# Patient Record
Sex: Female | Born: 1960 | Race: White | Hispanic: No | Smoking: Current every day smoker
Health system: Southern US, Community
[De-identification: ages and names within clinical notes are randomized; demographics above are authoritative.]

## PROBLEM LIST (undated history)

## (undated) DIAGNOSIS — M549 Dorsalgia, unspecified: Secondary | ICD-10-CM

## (undated) DIAGNOSIS — M797 Fibromyalgia: Secondary | ICD-10-CM

## (undated) DIAGNOSIS — E079 Disorder of thyroid, unspecified: Secondary | ICD-10-CM

---

## 2005-12-31 ENCOUNTER — Ambulatory Visit: Payer: Self-pay | Admitting: Family Medicine

## 2006-02-19 DIAGNOSIS — F17200 Nicotine dependence, unspecified, uncomplicated: Secondary | ICD-10-CM | POA: Insufficient documentation

## 2006-02-19 DIAGNOSIS — N951 Menopausal and female climacteric states: Secondary | ICD-10-CM | POA: Insufficient documentation

## 2006-02-19 DIAGNOSIS — E049 Nontoxic goiter, unspecified: Secondary | ICD-10-CM | POA: Insufficient documentation

## 2006-02-19 DIAGNOSIS — IMO0001 Reserved for inherently not codable concepts without codable children: Secondary | ICD-10-CM | POA: Insufficient documentation

## 2006-12-23 ENCOUNTER — Ambulatory Visit: Payer: Self-pay | Admitting: Family Medicine

## 2006-12-23 DIAGNOSIS — M25559 Pain in unspecified hip: Secondary | ICD-10-CM | POA: Insufficient documentation

## 2006-12-23 DIAGNOSIS — N926 Irregular menstruation, unspecified: Secondary | ICD-10-CM | POA: Insufficient documentation

## 2012-09-15 ENCOUNTER — Emergency Department (HOSPITAL_BASED_OUTPATIENT_CLINIC_OR_DEPARTMENT_OTHER)
Admission: EM | Admit: 2012-09-15 | Discharge: 2012-09-15 | Disposition: A | Discharge status: Home / Self Care | Payer: Medicare Other | Attending: Emergency Medicine | Admitting: Emergency Medicine

## 2012-09-15 ENCOUNTER — Ambulatory Visit: Payer: Self-pay | Admitting: Physician Assistant

## 2012-09-15 ENCOUNTER — Encounter (HOSPITAL_BASED_OUTPATIENT_CLINIC_OR_DEPARTMENT_OTHER): Payer: Self-pay | Admitting: *Deleted

## 2012-09-15 DIAGNOSIS — F17200 Nicotine dependence, unspecified, uncomplicated: Secondary | ICD-10-CM | POA: Insufficient documentation

## 2012-09-15 DIAGNOSIS — Z0289 Encounter for other administrative examinations: Secondary | ICD-10-CM

## 2012-09-15 DIAGNOSIS — M545 Low back pain, unspecified: Secondary | ICD-10-CM

## 2012-09-15 DIAGNOSIS — Z8639 Personal history of other endocrine, nutritional and metabolic disease: Secondary | ICD-10-CM | POA: Insufficient documentation

## 2012-09-15 DIAGNOSIS — Z862 Personal history of diseases of the blood and blood-forming organs and certain disorders involving the immune mechanism: Secondary | ICD-10-CM | POA: Insufficient documentation

## 2012-09-15 DIAGNOSIS — Z79899 Other long term (current) drug therapy: Secondary | ICD-10-CM | POA: Insufficient documentation

## 2012-09-15 DIAGNOSIS — IMO0001 Reserved for inherently not codable concepts without codable children: Secondary | ICD-10-CM | POA: Insufficient documentation

## 2012-09-15 HISTORY — DX: Fibromyalgia: M79.7

## 2012-09-15 HISTORY — DX: Disorder of thyroid, unspecified: E07.9

## 2012-09-15 LAB — URINALYSIS, ROUTINE W REFLEX MICROSCOPIC
Bilirubin Urine: NEGATIVE
Ketones, ur: NEGATIVE mg/dL
Nitrite: NEGATIVE
Protein, ur: NEGATIVE mg/dL

## 2012-09-15 MED ORDER — METHOCARBAMOL 500 MG PO TABS: 500.0000 mg | ORAL_TABLET | Freq: Two times a day (BID) | ORAL | Status: DC

## 2012-09-15 MED ORDER — PREDNISONE 20 MG PO TABS: 40.0000 mg | ORAL_TABLET | Freq: Every day | ORAL | Status: DC

## 2012-09-15 NOTE — ED Notes (Signed)
Pt declined wheelchair to transport to room.  Pt requested to walk

## 2012-09-15 NOTE — ED Notes (Signed)
Pt alert and oriented.  Appears in no distress but patient not happy about not having xray.  Pt states "I need an MRI and someone is going to have to do it"  Informed can get rx here at pharmacy but patient refused to get them filled here at [facility].

## 2012-09-15 NOTE — ED Notes (Signed)
PA at bedside.

## 2012-09-15 NOTE — ED Provider Notes (Signed)
History     CSN: [id number]  Arrival date & time 09/15/12  1455   First MD Initiated Contact with Patient 09/15/12 1504      Chief Complaint  Patient presents with  . Back Pain    (Consider location/radiation/quality/duration/timing/severity/associated sxs/prior treatment) HPI Comments: Patient presents with lower back pain.  Pain located across her lower back.  Pain does not radiate.  No acute injury or trauma.  She was seen in the ED at [facility] 1.5 weeks ago for the same pain.  She reports that they gave her a prescription for Prednisone, which she has forgotten to take.  She reports that she has also taken Flexeril, but felt that the Flexeril made her tired.  She is currently on Methadone tid and Norco for pain associated with Fibromyalgia.  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  Patient is a 52 y.o. female presenting with back pain. The history is provided by the patient.  Back Pain Location:  Lumbar spine (across lower back) Quality:  Stabbing Radiates to:  Does not radiate Pain is:  Same all the time Onset quality:  Gradual Duration:  2 weeks Timing:  Constant Progression:  Unchanged Context: not lifting heavy objects   Relieved by:  Nothing Worsened by:  Bending and sitting Associated symptoms: no bladder incontinence, no bowel incontinence, no dysuria, no fever, no leg pain, no numbness, no paresthesias, no tingling and no weakness   Risk factors: no hx of cancer   Risk factors comment:  No history of IVDU.   Not immunocompromised.   Past Medical History  Diagnosis Date  . Fibromyalgia   . Thyroid disease     History reviewed. No pertinent past surgical history.  No family history on file.  History  Substance Use Topics  . Smoking status: Current Every Day Smoker -- 1.00 packs/day    Types: Cigarettes  . Smokeless tobacco: Not on file  . Alcohol Use: No    OB History   Grav Para Term Preterm Abortions TAB SAB  Ect Mult Living                  Review of Systems  Constitutional: Negative for fever and chills.  Gastrointestinal: Negative for bowel incontinence.  Genitourinary: Negative for bladder incontinence and dysuria.  Musculoskeletal: Positive for back pain.  Neurological: Negative for tingling, weakness, numbness and paresthesias.    Allergies  Acetaminophen-codeine  Home Medications   Current Outpatient Rx  Name  Route  Sig  Dispense  Refill  . Hydrocodone-Acetaminophen (NORCO PO)   Oral   Take by mouth.         . methadone (DOLOPHINE) 10 MG tablet   Oral   Take 10 mg by mouth every 8 (eight) hours.           BP 130/96  Pulse 58  Temp(Src) 98.8 F (37.1 C) (Oral)  Resp 18  Wt 151 lb (68.493 kg)  SpO2 97%  Physical Exam  Nursing note and vitals reviewed. Constitutional: She appears well-developed and well-nourished.  HENT:  Head: Normocephalic and atraumatic.  Neck: Normal range of motion. Neck supple.  Cardiovascular: Normal rate, regular rhythm and normal heart sounds.   Pulmonary/Chest: Effort normal and breath sounds normal.  Musculoskeletal: Normal range of motion.       Lumbar back: She exhibits tenderness and bony tenderness. She exhibits normal range of motion, no swelling, no edema and no deformity.  Pain with extension  of the lower back and lateral rotation.  Neurological: She is alert. She has normal strength. No sensory deficit. Gait normal.  Reflex Scores:      Patellar reflexes are 2+ on the right side and 2+ on the left side.      Achilles reflexes are 2+ on the right side and 2+ on the left side. Skin: Skin is warm and dry. No rash noted. No erythema.  Psychiatric: She has a normal mood and affect.    ED Course  Procedures (including critical care time)  Labs Reviewed  URINALYSIS, ROUTINE W REFLEX MICROSCOPIC   No results found.   No diagnosis found.    MDM  Patient with back pain.  No neurological deficits and normal neuro  exam.  Patient can walk but states is painful.  No loss of bowel or bladder control.  No concern for cauda equina.  No fever, night sweats, weight loss, h/o cancer, IVDU.  RICE protocol  discussed with patient.  Patient given prescription for Prednisone and Robaxin.  She is currently on Methadone and Norco for pain.  Return precautions given.        Pascal Lux [place], PA-C 09/15/12 [phone]

## 2012-09-15 NOTE — ED Notes (Signed)
Lower back pain x 2 weeks

## 2012-09-17 NOTE — ED Provider Notes (Signed)
Medical screening examination/treatment/procedure(s) were performed by non-physician practitioner and as supervising physician I was immediately available for consultation/collaboration.   Shelda Jakes, MD 09/17/12 [phone]

## 2015-01-13 ENCOUNTER — Ambulatory Visit: Payer: Medicare Other | Admitting: Rehabilitative and Restorative Service Providers"

## 2016-07-24 DIAGNOSIS — G894 Chronic pain syndrome: Secondary | ICD-10-CM | POA: Diagnosis not present

## 2016-07-24 DIAGNOSIS — G4700 Insomnia, unspecified: Secondary | ICD-10-CM | POA: Diagnosis not present

## 2016-07-24 DIAGNOSIS — M170 Bilateral primary osteoarthritis of knee: Secondary | ICD-10-CM | POA: Diagnosis not present

## 2016-07-24 DIAGNOSIS — M47816 Spondylosis without myelopathy or radiculopathy, lumbar region: Secondary | ICD-10-CM | POA: Diagnosis not present

## 2016-08-21 DIAGNOSIS — M47816 Spondylosis without myelopathy or radiculopathy, lumbar region: Secondary | ICD-10-CM | POA: Diagnosis not present

## 2016-08-21 DIAGNOSIS — M170 Bilateral primary osteoarthritis of knee: Secondary | ICD-10-CM | POA: Diagnosis not present

## 2016-08-21 DIAGNOSIS — G4700 Insomnia, unspecified: Secondary | ICD-10-CM | POA: Diagnosis not present

## 2016-08-21 DIAGNOSIS — G894 Chronic pain syndrome: Secondary | ICD-10-CM | POA: Diagnosis not present

## 2016-09-19 DIAGNOSIS — M47816 Spondylosis without myelopathy or radiculopathy, lumbar region: Secondary | ICD-10-CM | POA: Diagnosis not present

## 2016-09-19 DIAGNOSIS — G894 Chronic pain syndrome: Secondary | ICD-10-CM | POA: Diagnosis not present

## 2016-09-19 DIAGNOSIS — M170 Bilateral primary osteoarthritis of knee: Secondary | ICD-10-CM | POA: Diagnosis not present

## 2016-09-19 DIAGNOSIS — G4700 Insomnia, unspecified: Secondary | ICD-10-CM | POA: Diagnosis not present

## 2016-10-07 DIAGNOSIS — Z8709 Personal history of other diseases of the respiratory system: Secondary | ICD-10-CM | POA: Diagnosis not present

## 2016-10-07 DIAGNOSIS — M797 Fibromyalgia: Secondary | ICD-10-CM | POA: Diagnosis not present

## 2016-10-07 DIAGNOSIS — M545 Low back pain: Secondary | ICD-10-CM | POA: Diagnosis not present

## 2016-10-07 DIAGNOSIS — G8929 Other chronic pain: Secondary | ICD-10-CM | POA: Diagnosis not present

## 2016-10-07 DIAGNOSIS — M5136 Other intervertebral disc degeneration, lumbar region: Secondary | ICD-10-CM | POA: Diagnosis not present

## 2016-10-07 DIAGNOSIS — Z886 Allergy status to analgesic agent status: Secondary | ICD-10-CM | POA: Diagnosis not present

## 2016-10-07 DIAGNOSIS — Z720 Tobacco use: Secondary | ICD-10-CM | POA: Diagnosis not present

## 2016-10-17 DIAGNOSIS — G4700 Insomnia, unspecified: Secondary | ICD-10-CM | POA: Diagnosis not present

## 2016-10-17 DIAGNOSIS — M170 Bilateral primary osteoarthritis of knee: Secondary | ICD-10-CM | POA: Diagnosis not present

## 2016-10-17 DIAGNOSIS — G894 Chronic pain syndrome: Secondary | ICD-10-CM | POA: Diagnosis not present

## 2016-10-17 DIAGNOSIS — M47816 Spondylosis without myelopathy or radiculopathy, lumbar region: Secondary | ICD-10-CM | POA: Diagnosis not present

## 2016-12-17 DIAGNOSIS — G894 Chronic pain syndrome: Secondary | ICD-10-CM | POA: Diagnosis not present

## 2016-12-17 DIAGNOSIS — M5417 Radiculopathy, lumbosacral region: Secondary | ICD-10-CM | POA: Diagnosis not present

## 2016-12-17 DIAGNOSIS — M47816 Spondylosis without myelopathy or radiculopathy, lumbar region: Secondary | ICD-10-CM | POA: Diagnosis not present

## 2016-12-17 DIAGNOSIS — M545 Low back pain: Secondary | ICD-10-CM | POA: Diagnosis not present

## 2016-12-17 DIAGNOSIS — M1990 Unspecified osteoarthritis, unspecified site: Secondary | ICD-10-CM | POA: Diagnosis not present

## 2016-12-17 DIAGNOSIS — G4700 Insomnia, unspecified: Secondary | ICD-10-CM | POA: Diagnosis not present

## 2016-12-17 DIAGNOSIS — M170 Bilateral primary osteoarthritis of knee: Secondary | ICD-10-CM | POA: Diagnosis not present

## 2016-12-17 DIAGNOSIS — Z79891 Long term (current) use of opiate analgesic: Secondary | ICD-10-CM | POA: Diagnosis not present

## 2016-12-19 ENCOUNTER — Other Ambulatory Visit: Payer: Self-pay | Admitting: Physical Medicine and Rehabilitation

## 2016-12-19 DIAGNOSIS — M5416 Radiculopathy, lumbar region: Secondary | ICD-10-CM

## 2016-12-24 ENCOUNTER — Ambulatory Visit (INDEPENDENT_AMBULATORY_CARE_PROVIDER_SITE_OTHER): Payer: Medicare Other

## 2016-12-24 DIAGNOSIS — M48061 Spinal stenosis, lumbar region without neurogenic claudication: Secondary | ICD-10-CM

## 2016-12-24 DIAGNOSIS — M5126 Other intervertebral disc displacement, lumbar region: Secondary | ICD-10-CM

## 2016-12-24 DIAGNOSIS — M545 Low back pain: Secondary | ICD-10-CM | POA: Diagnosis not present

## 2016-12-24 DIAGNOSIS — M5416 Radiculopathy, lumbar region: Secondary | ICD-10-CM

## 2016-12-31 ENCOUNTER — Other Ambulatory Visit: Payer: Medicare Other

## 2017-01-16 DIAGNOSIS — G4700 Insomnia, unspecified: Secondary | ICD-10-CM | POA: Diagnosis not present

## 2017-01-16 DIAGNOSIS — G894 Chronic pain syndrome: Secondary | ICD-10-CM | POA: Diagnosis not present

## 2017-01-16 DIAGNOSIS — M47816 Spondylosis without myelopathy or radiculopathy, lumbar region: Secondary | ICD-10-CM | POA: Diagnosis not present

## 2017-01-16 DIAGNOSIS — M170 Bilateral primary osteoarthritis of knee: Secondary | ICD-10-CM | POA: Diagnosis not present

## 2017-02-11 DIAGNOSIS — G4700 Insomnia, unspecified: Secondary | ICD-10-CM | POA: Diagnosis not present

## 2017-02-11 DIAGNOSIS — G894 Chronic pain syndrome: Secondary | ICD-10-CM | POA: Diagnosis not present

## 2017-02-11 DIAGNOSIS — M47816 Spondylosis without myelopathy or radiculopathy, lumbar region: Secondary | ICD-10-CM | POA: Diagnosis not present

## 2017-02-11 DIAGNOSIS — M170 Bilateral primary osteoarthritis of knee: Secondary | ICD-10-CM | POA: Diagnosis not present

## 2017-03-11 DIAGNOSIS — M47816 Spondylosis without myelopathy or radiculopathy, lumbar region: Secondary | ICD-10-CM | POA: Diagnosis not present

## 2017-03-11 DIAGNOSIS — M170 Bilateral primary osteoarthritis of knee: Secondary | ICD-10-CM | POA: Diagnosis not present

## 2017-03-11 DIAGNOSIS — G4700 Insomnia, unspecified: Secondary | ICD-10-CM | POA: Diagnosis not present

## 2017-03-11 DIAGNOSIS — G894 Chronic pain syndrome: Secondary | ICD-10-CM | POA: Diagnosis not present

## 2017-04-08 DIAGNOSIS — G4700 Insomnia, unspecified: Secondary | ICD-10-CM | POA: Diagnosis not present

## 2017-04-08 DIAGNOSIS — M47816 Spondylosis without myelopathy or radiculopathy, lumbar region: Secondary | ICD-10-CM | POA: Diagnosis not present

## 2017-04-08 DIAGNOSIS — M170 Bilateral primary osteoarthritis of knee: Secondary | ICD-10-CM | POA: Diagnosis not present

## 2017-04-08 DIAGNOSIS — G894 Chronic pain syndrome: Secondary | ICD-10-CM | POA: Diagnosis not present

## 2017-05-10 DIAGNOSIS — G4700 Insomnia, unspecified: Secondary | ICD-10-CM | POA: Diagnosis not present

## 2017-05-10 DIAGNOSIS — G894 Chronic pain syndrome: Secondary | ICD-10-CM | POA: Diagnosis not present

## 2017-05-10 DIAGNOSIS — M47816 Spondylosis without myelopathy or radiculopathy, lumbar region: Secondary | ICD-10-CM | POA: Diagnosis not present

## 2017-05-10 DIAGNOSIS — M170 Bilateral primary osteoarthritis of knee: Secondary | ICD-10-CM | POA: Diagnosis not present

## 2017-06-04 DIAGNOSIS — G894 Chronic pain syndrome: Secondary | ICD-10-CM | POA: Diagnosis not present

## 2017-06-04 DIAGNOSIS — M47816 Spondylosis without myelopathy or radiculopathy, lumbar region: Secondary | ICD-10-CM | POA: Diagnosis not present

## 2017-06-04 DIAGNOSIS — M170 Bilateral primary osteoarthritis of knee: Secondary | ICD-10-CM | POA: Diagnosis not present

## 2017-06-04 DIAGNOSIS — G4700 Insomnia, unspecified: Secondary | ICD-10-CM | POA: Diagnosis not present

## 2017-07-05 DIAGNOSIS — M47816 Spondylosis without myelopathy or radiculopathy, lumbar region: Secondary | ICD-10-CM | POA: Diagnosis not present

## 2017-07-05 DIAGNOSIS — G4700 Insomnia, unspecified: Secondary | ICD-10-CM | POA: Diagnosis not present

## 2017-07-05 DIAGNOSIS — G894 Chronic pain syndrome: Secondary | ICD-10-CM | POA: Diagnosis not present

## 2017-07-05 DIAGNOSIS — M170 Bilateral primary osteoarthritis of knee: Secondary | ICD-10-CM | POA: Diagnosis not present

## 2017-08-05 DIAGNOSIS — Z79891 Long term (current) use of opiate analgesic: Secondary | ICD-10-CM | POA: Diagnosis not present

## 2017-08-05 DIAGNOSIS — M47816 Spondylosis without myelopathy or radiculopathy, lumbar region: Secondary | ICD-10-CM | POA: Diagnosis not present

## 2017-08-05 DIAGNOSIS — G4700 Insomnia, unspecified: Secondary | ICD-10-CM | POA: Diagnosis not present

## 2017-08-05 DIAGNOSIS — G894 Chronic pain syndrome: Secondary | ICD-10-CM | POA: Diagnosis not present

## 2017-08-05 DIAGNOSIS — M170 Bilateral primary osteoarthritis of knee: Secondary | ICD-10-CM | POA: Diagnosis not present

## 2017-09-02 DIAGNOSIS — M170 Bilateral primary osteoarthritis of knee: Secondary | ICD-10-CM | POA: Diagnosis not present

## 2017-09-02 DIAGNOSIS — M47816 Spondylosis without myelopathy or radiculopathy, lumbar region: Secondary | ICD-10-CM | POA: Diagnosis not present

## 2017-09-02 DIAGNOSIS — G4700 Insomnia, unspecified: Secondary | ICD-10-CM | POA: Diagnosis not present

## 2017-09-02 DIAGNOSIS — G894 Chronic pain syndrome: Secondary | ICD-10-CM | POA: Diagnosis not present

## 2017-10-01 DIAGNOSIS — G894 Chronic pain syndrome: Secondary | ICD-10-CM | POA: Diagnosis not present

## 2017-10-01 DIAGNOSIS — G4700 Insomnia, unspecified: Secondary | ICD-10-CM | POA: Diagnosis not present

## 2017-10-01 DIAGNOSIS — M47816 Spondylosis without myelopathy or radiculopathy, lumbar region: Secondary | ICD-10-CM | POA: Diagnosis not present

## 2017-10-01 DIAGNOSIS — M170 Bilateral primary osteoarthritis of knee: Secondary | ICD-10-CM | POA: Diagnosis not present

## 2017-11-28 DIAGNOSIS — G4700 Insomnia, unspecified: Secondary | ICD-10-CM | POA: Diagnosis not present

## 2017-11-28 DIAGNOSIS — M47816 Spondylosis without myelopathy or radiculopathy, lumbar region: Secondary | ICD-10-CM | POA: Diagnosis not present

## 2017-11-28 DIAGNOSIS — M170 Bilateral primary osteoarthritis of knee: Secondary | ICD-10-CM | POA: Diagnosis not present

## 2017-11-28 DIAGNOSIS — G894 Chronic pain syndrome: Secondary | ICD-10-CM | POA: Diagnosis not present

## 2017-12-26 DIAGNOSIS — G894 Chronic pain syndrome: Secondary | ICD-10-CM | POA: Diagnosis not present

## 2017-12-26 DIAGNOSIS — M47816 Spondylosis without myelopathy or radiculopathy, lumbar region: Secondary | ICD-10-CM | POA: Diagnosis not present

## 2017-12-26 DIAGNOSIS — G4700 Insomnia, unspecified: Secondary | ICD-10-CM | POA: Diagnosis not present

## 2017-12-26 DIAGNOSIS — M170 Bilateral primary osteoarthritis of knee: Secondary | ICD-10-CM | POA: Diagnosis not present

## 2017-12-27 DIAGNOSIS — M5416 Radiculopathy, lumbar region: Secondary | ICD-10-CM | POA: Diagnosis not present

## 2018-01-02 DIAGNOSIS — S8002XA Contusion of left knee, initial encounter: Secondary | ICD-10-CM | POA: Diagnosis not present

## 2018-01-02 DIAGNOSIS — Z885 Allergy status to narcotic agent status: Secondary | ICD-10-CM | POA: Diagnosis not present

## 2018-01-02 DIAGNOSIS — S39012A Strain of muscle, fascia and tendon of lower back, initial encounter: Secondary | ICD-10-CM | POA: Diagnosis not present

## 2018-01-02 DIAGNOSIS — S161XXA Strain of muscle, fascia and tendon at neck level, initial encounter: Secondary | ICD-10-CM | POA: Diagnosis not present

## 2018-01-02 DIAGNOSIS — R52 Pain, unspecified: Secondary | ICD-10-CM | POA: Diagnosis not present

## 2018-01-07 DIAGNOSIS — M170 Bilateral primary osteoarthritis of knee: Secondary | ICD-10-CM | POA: Diagnosis not present

## 2018-01-07 DIAGNOSIS — G4700 Insomnia, unspecified: Secondary | ICD-10-CM | POA: Diagnosis not present

## 2018-01-07 DIAGNOSIS — G894 Chronic pain syndrome: Secondary | ICD-10-CM | POA: Diagnosis not present

## 2018-01-07 DIAGNOSIS — M47816 Spondylosis without myelopathy or radiculopathy, lumbar region: Secondary | ICD-10-CM | POA: Diagnosis not present

## 2018-01-09 ENCOUNTER — Emergency Department (INDEPENDENT_AMBULATORY_CARE_PROVIDER_SITE_OTHER)
Admission: EM | Admit: 2018-01-09 | Discharge: 2018-01-09 | Disposition: A | Discharge status: Home / Self Care | Payer: Self-pay | Source: Home / Self Care

## 2018-01-09 ENCOUNTER — Other Ambulatory Visit: Payer: Self-pay

## 2018-01-09 ENCOUNTER — Encounter: Payer: Self-pay | Admitting: Emergency Medicine

## 2018-01-09 DIAGNOSIS — S39012A Strain of muscle, fascia and tendon of lower back, initial encounter: Secondary | ICD-10-CM

## 2018-01-09 DIAGNOSIS — E049 Nontoxic goiter, unspecified: Secondary | ICD-10-CM

## 2018-01-09 DIAGNOSIS — M545 Low back pain, unspecified: Secondary | ICD-10-CM

## 2018-01-09 DIAGNOSIS — S29012A Strain of muscle and tendon of back wall of thorax, initial encounter: Secondary | ICD-10-CM

## 2018-01-09 DIAGNOSIS — S161XXA Strain of muscle, fascia and tendon at neck level, initial encounter: Secondary | ICD-10-CM

## 2018-01-09 DIAGNOSIS — G8929 Other chronic pain: Secondary | ICD-10-CM

## 2018-01-09 MED ORDER — PREDNISONE 20 MG PO TABS: 40.0000 mg | ORAL_TABLET | Freq: Every day | ORAL | 0 refills | Status: DC

## 2018-01-09 NOTE — Discharge Instructions (Addendum)
Try hot showers twice daily and gentle range of motion movements to keep the muscles from tightening.
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
## 2018-02-04 ENCOUNTER — Emergency Department (INDEPENDENT_AMBULATORY_CARE_PROVIDER_SITE_OTHER)
Admission: EM | Admit: 2018-02-04 | Discharge: 2018-02-04 | Disposition: A | Discharge status: Home / Self Care | Payer: Self-pay | Source: Home / Self Care | Attending: Family Medicine | Admitting: Family Medicine

## 2018-02-04 ENCOUNTER — Encounter: Payer: Self-pay | Admitting: Emergency Medicine

## 2018-02-04 ENCOUNTER — Emergency Department (INDEPENDENT_AMBULATORY_CARE_PROVIDER_SITE_OTHER): Payer: Medicare Other

## 2018-02-04 DIAGNOSIS — M546 Pain in thoracic spine: Secondary | ICD-10-CM | POA: Diagnosis not present

## 2018-02-04 DIAGNOSIS — M4804 Spinal stenosis, thoracic region: Secondary | ICD-10-CM

## 2018-02-04 DIAGNOSIS — M7918 Myalgia, other site: Secondary | ICD-10-CM

## 2018-02-04 DIAGNOSIS — G8929 Other chronic pain: Secondary | ICD-10-CM

## 2018-02-04 MED ORDER — PREDNISONE 10 MG PO TABS: ORAL_TABLET | ORAL | 0 refills | Status: DC

## 2018-02-04 NOTE — Discharge Instructions (Addendum)
Apply heating pad to painful areas 2 or 3 times daily.  Begin range of motion and stretching exercises as tolerated.
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
## 2018-02-10 ENCOUNTER — Encounter: Payer: Medicare Other | Admitting: Sports Medicine

## 2018-02-14 ENCOUNTER — Ambulatory Visit (INDEPENDENT_AMBULATORY_CARE_PROVIDER_SITE_OTHER): Payer: Self-pay | Admitting: Sports Medicine

## 2018-02-14 ENCOUNTER — Encounter: Payer: Self-pay | Admitting: Sports Medicine

## 2018-02-14 DIAGNOSIS — M47812 Spondylosis without myelopathy or radiculopathy, cervical region: Secondary | ICD-10-CM | POA: Insufficient documentation

## 2018-02-14 MED ORDER — MELOXICAM 15 MG PO TABS: ORAL_TABLET | ORAL | 3 refills | Status: DC
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
## 2018-02-24 DIAGNOSIS — G894 Chronic pain syndrome: Secondary | ICD-10-CM | POA: Diagnosis not present

## 2018-02-24 DIAGNOSIS — M170 Bilateral primary osteoarthritis of knee: Secondary | ICD-10-CM | POA: Diagnosis not present

## 2018-02-24 DIAGNOSIS — G4700 Insomnia, unspecified: Secondary | ICD-10-CM | POA: Diagnosis not present

## 2018-02-24 DIAGNOSIS — M47816 Spondylosis without myelopathy or radiculopathy, lumbar region: Secondary | ICD-10-CM | POA: Diagnosis not present

## 2018-03-06 ENCOUNTER — Telehealth: Payer: Self-pay | Admitting: Family Medicine

## 2018-03-06 NOTE — Telephone Encounter (Signed)
Look in the referral notes, they have tried multiple times to call her to reschedule with the [place] facility.  At this point she can just call [facility] [place] [facility] to schedule her appointment.

## 2018-03-14 ENCOUNTER — Ambulatory Visit: Payer: Medicare Other | Admitting: Sports Medicine

## 2018-03-17 ENCOUNTER — Encounter: Payer: Self-pay | Admitting: Sports Medicine

## 2018-03-17 ENCOUNTER — Ambulatory Visit (INDEPENDENT_AMBULATORY_CARE_PROVIDER_SITE_OTHER): Payer: Self-pay | Admitting: Sports Medicine

## 2018-03-17 ENCOUNTER — Ambulatory Visit (INDEPENDENT_AMBULATORY_CARE_PROVIDER_SITE_OTHER): Payer: Self-pay

## 2018-03-17 DIAGNOSIS — M47812 Spondylosis without myelopathy or radiculopathy, cervical region: Secondary | ICD-10-CM

## 2018-03-17 DIAGNOSIS — M50223 Other cervical disc displacement at C6-C7 level: Secondary | ICD-10-CM | POA: Diagnosis not present

## 2018-03-17 DIAGNOSIS — M50222 Other cervical disc displacement at C5-C6 level: Secondary | ICD-10-CM

## 2018-03-17 NOTE — Assessment & Plan Note (Signed)
Multilevel cervical DDD and facet arthritis. Several courses of prednisone have not improved symptoms. She has not done physical therapy and not had a cervical spine MRI. I am placing another referral to a different physical therapist, and I would like to see if we can get her MRI done today. She declines neuropathic agents such as gabapentin and Cymbalta. 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

## 2018-03-17 NOTE — Progress Notes (Signed)
Subjective:    CC: Follow-up  HPI: Neck pain: With multilevel cervical spondylosis, she has not done any physical therapy and has not had her MRI.  She also has lumbar DDD, her pain doctor has been doing epidurals, I have advised her to keep her care with her lumbar spine with the current treating provider.  I reviewed the past medical history, family history, social history, surgical history, and allergies today and no changes were needed.  Please see the problem list section below in epic for further details.  Past Medical History: Past Medical History:  Diagnosis Date  . Fibromyalgia   . Thyroid disease    Past Surgical History: No past surgical history on file. Social History: Social History   Socioeconomic History  . Marital status: Divorced    Spouse name: Not on file  . Number of children: Not on file  . Years of education: Not on file  . Highest education level: Not on file  Occupational History  . Not on file  Social Needs  . Financial resource strain: Not on file  . Food insecurity:    Worry: Not on file    Inability: Not on file  . Transportation needs:    Medical: Not on file    Non-medical: Not on file  Tobacco Use  . Smoking status: Current Every Day Smoker    Packs/day: 1.00    Types: Cigarettes  . Smokeless tobacco: Never Used  Substance and Sexual Activity  . Alcohol use: No  . Drug use: Not on file  . Sexual activity: Not on file  Lifestyle  . Physical activity:    Days per week: Not on file    Minutes per session: Not on file  . Stress: Not on file  Relationships  . Social connections:    Talks on phone: Not on file    Gets together: Not on file    Attends religious service: Not on file    Active member of club or organization: Not on file    Attends meetings of clubs or organizations: Not on file    Relationship status: Not on file  Other Topics Concern  . Not on file  Social History Narrative  . Not on file   Family History: No  family history on file. Allergies: Allergies  Allergen Reactions  . Acetaminophen-Codeine     REACTION: Rash and itching   Medications: See med rec.  Review of Systems: No fevers, chills, night sweats, weight loss, chest pain, or shortness of breath.   Objective:    General: Well Developed, well nourished, and in no acute distress.  Neuro: Alert and oriented x3, extra-ocular muscles intact, sensation grossly intact.  HEENT: Normocephalic, atraumatic, pupils equal round reactive to light, neck supple, no masses, no lymphadenopathy, thyroid nonpalpable.  Skin: Warm and dry, no rashes. Cardiac: Regular rate and rhythm, no murmurs rubs or gallops, no lower extremity edema.  Respiratory: Clear to auscultation bilaterally. Not using accessory muscles, speaking in full sentences.  Impression and Recommendations:    Cervical spondylosis Multilevel cervical DDD and facet arthritis. Several courses of prednisone have not improved symptoms. She has not done physical therapy and not had a cervical spine MRI. I am placing another referral to a different physical therapist, and I would like to see if we can get her MRI done today. She declines neuropathic agents such as gabapentin and Cymbalta. 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  ___________________________________________ Ihor Austin. Benjamin Stain,  M.D., ABFM., CAQSM. Primary Care and Sports Medicine [facility]  Adjunct Professor of Family Medicine  [employer]

## 2018-03-24 DIAGNOSIS — M47816 Spondylosis without myelopathy or radiculopathy, lumbar region: Secondary | ICD-10-CM | POA: Diagnosis not present

## 2018-03-24 DIAGNOSIS — Z79891 Long term (current) use of opiate analgesic: Secondary | ICD-10-CM | POA: Diagnosis not present

## 2018-03-24 DIAGNOSIS — G894 Chronic pain syndrome: Secondary | ICD-10-CM | POA: Diagnosis not present

## 2018-03-24 DIAGNOSIS — G4700 Insomnia, unspecified: Secondary | ICD-10-CM | POA: Diagnosis not present

## 2018-03-24 DIAGNOSIS — M170 Bilateral primary osteoarthritis of knee: Secondary | ICD-10-CM | POA: Diagnosis not present

## 2018-03-31 DIAGNOSIS — M542 Cervicalgia: Secondary | ICD-10-CM | POA: Diagnosis not present

## 2018-03-31 DIAGNOSIS — M47812 Spondylosis without myelopathy or radiculopathy, cervical region: Secondary | ICD-10-CM | POA: Diagnosis not present

## 2018-03-31 DIAGNOSIS — M6281 Muscle weakness (generalized): Secondary | ICD-10-CM | POA: Diagnosis not present

## 2018-03-31 DIAGNOSIS — M545 Low back pain: Secondary | ICD-10-CM | POA: Diagnosis not present

## 2018-04-02 ENCOUNTER — Ambulatory Visit (INDEPENDENT_AMBULATORY_CARE_PROVIDER_SITE_OTHER): Payer: Medicare Other

## 2018-04-02 ENCOUNTER — Ambulatory Visit (INDEPENDENT_AMBULATORY_CARE_PROVIDER_SITE_OTHER): Payer: Medicare Other | Admitting: Family Medicine

## 2018-04-02 ENCOUNTER — Encounter: Payer: Self-pay | Admitting: Family Medicine

## 2018-04-02 VITALS — BP 121/66 | HR 66 | Ht 62.0 in | Wt 122.0 lb

## 2018-04-02 DIAGNOSIS — S39012A Strain of muscle, fascia and tendon of lower back, initial encounter: Secondary | ICD-10-CM

## 2018-04-02 DIAGNOSIS — M47812 Spondylosis without myelopathy or radiculopathy, cervical region: Secondary | ICD-10-CM

## 2018-04-02 DIAGNOSIS — M7061 Trochanteric bursitis, right hip: Secondary | ICD-10-CM

## 2018-04-02 DIAGNOSIS — M5136 Other intervertebral disc degeneration, lumbar region: Secondary | ICD-10-CM | POA: Diagnosis not present

## 2018-04-02 DIAGNOSIS — M62838 Other muscle spasm: Secondary | ICD-10-CM

## 2018-04-02 DIAGNOSIS — M546 Pain in thoracic spine: Secondary | ICD-10-CM | POA: Diagnosis not present

## 2018-04-02 DIAGNOSIS — M545 Low back pain: Secondary | ICD-10-CM | POA: Diagnosis not present

## 2018-04-02 NOTE — Patient Instructions (Addendum)
Thank you for coming in today. I am broadening PT orders.  You should be able to go to your appointment Friday.   Use heating pad and medicines.  Use TENS unit.   Recheck with me or Dr T as needed.  I will make sure [facility] knows whats going on.   If not improving with back will proceed with MRI.  Get xray today on the way out.    TENS UNIT: This is helpful for muscle pain and spasm.   Search and Purchase a TENS 7000 2nd edition at  www.tenspros.com or www.Amazon.com It should be less than $30.     TENS unit instructions: Do not shower or bathe with the unit on Turn the unit off before removing electrodes or batteries If the electrodes lose stickiness add a drop of water to the electrodes after they are disconnected from the unit and place on plastic sheet. If you continued to have difficulty, call the TENS unit company to purchase more electrodes. Do not apply lotion on the skin area prior to use. Make sure the skin is clean and dry as this will help prolong the life of the electrodes. After use, always check skin for unusual red areas, rash or other skin difficulties. If there are any skin problems, does not apply electrodes to the same area. Never remove the electrodes from the unit by pulling the wires. Do not use the TENS unit or electrodes other than as directed. Do not change electrode placement without consultating your therapist or physician. Keep 2 fingers with between each electrode. Wear time ratio is 2:1, on to off times.    For example on for 30 minutes off for 15 minutes and then on for 30 minutes off for 15 minutes
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
## 2018-04-08 DIAGNOSIS — M6281 Muscle weakness (generalized): Secondary | ICD-10-CM | POA: Diagnosis not present

## 2018-04-08 DIAGNOSIS — M47812 Spondylosis without myelopathy or radiculopathy, cervical region: Secondary | ICD-10-CM | POA: Diagnosis not present

## 2018-04-08 DIAGNOSIS — M542 Cervicalgia: Secondary | ICD-10-CM | POA: Diagnosis not present

## 2018-04-08 DIAGNOSIS — M545 Low back pain: Secondary | ICD-10-CM | POA: Diagnosis not present

## 2018-04-21 DIAGNOSIS — M47816 Spondylosis without myelopathy or radiculopathy, lumbar region: Secondary | ICD-10-CM | POA: Diagnosis not present

## 2018-04-21 DIAGNOSIS — G894 Chronic pain syndrome: Secondary | ICD-10-CM | POA: Diagnosis not present

## 2018-04-21 DIAGNOSIS — G4700 Insomnia, unspecified: Secondary | ICD-10-CM | POA: Diagnosis not present

## 2018-04-21 DIAGNOSIS — M170 Bilateral primary osteoarthritis of knee: Secondary | ICD-10-CM | POA: Diagnosis not present

## 2018-04-28 ENCOUNTER — Ambulatory Visit (INDEPENDENT_AMBULATORY_CARE_PROVIDER_SITE_OTHER): Payer: Self-pay | Admitting: Sports Medicine

## 2018-04-28 ENCOUNTER — Encounter: Payer: Self-pay | Admitting: Sports Medicine

## 2018-04-28 ENCOUNTER — Ambulatory Visit: Payer: Medicare Other | Admitting: Sports Medicine

## 2018-04-28 DIAGNOSIS — M47812 Spondylosis without myelopathy or radiculopathy, cervical region: Secondary | ICD-10-CM

## 2018-04-28 NOTE — Progress Notes (Signed)
Subjective:    CC: Follow-up  HPI: Carolyn Bauer returns, she is a 57 year old female with cervical spondylosis, we started her in formal physical therapy, she was only able to tolerate 2 sessions.  She declines neuropathic agents such as Cymbalta and gabapentin.  She is on methadone at a pain clinic.  At this point she is agreeable to proceed with interventional treatment.  I reviewed the past medical history, family history, social history, surgical history, and allergies today and no changes were needed.  Please see the problem list section below in epic for further details.  Past Medical History: Past Medical History:  Diagnosis Date  . Fibromyalgia   . Thyroid disease    Past Surgical History: No past surgical history on file. Social History: Social History   Socioeconomic History  . Marital status: Divorced    Spouse name: Not on file  . Number of children: Not on file  . Years of education: Not on file  . Highest education level: Not on file  Occupational History  . Not on file  Social Needs  . Financial resource strain: Not on file  . Food insecurity:    Worry: Not on file    Inability: Not on file  . Transportation needs:    Medical: Not on file    Non-medical: Not on file  Tobacco Use  . Smoking status: Current Every Day Smoker    Packs/day: 1.00    Types: Cigarettes  . Smokeless tobacco: Never Used  Substance and Sexual Activity  . Alcohol use: No  . Drug use: Not on file  . Sexual activity: Not on file  Lifestyle  . Physical activity:    Days per week: Not on file    Minutes per session: Not on file  . Stress: Not on file  Relationships  . Social connections:    Talks on phone: Not on file    Gets together: Not on file    Attends religious service: Not on file    Active member of club or organization: Not on file    Attends meetings of clubs or organizations: Not on file    Relationship status: Not on file  Other Topics Concern  . Not on file  Social  History Narrative  . Not on file   Family History: No family history on file. Allergies: Allergies  Allergen Reactions  . Acetaminophen-Codeine     REACTION: Rash and itching   Medications: See med rec.  Review of Systems: No fevers, chills, night sweats, weight loss, chest pain, or shortness of breath.   Objective:    General: Well Developed, well nourished, and in no acute distress.  Neuro: Alert and oriented x3, extra-ocular muscles intact, sensation grossly intact.  HEENT: Normocephalic, atraumatic, pupils equal round reactive to light, neck supple, no masses, no lymphadenopathy, thyroid nonpalpable.  Skin: Warm and dry, no rashes. Cardiac: Regular rate and rhythm, no murmurs rubs or gallops, no lower extremity edema.  Respiratory: Clear to auscultation bilaterally. Not using accessory muscles, speaking in full sentences.  Cervical spine MRI personally reviewed, disc protrusions at C5-6 and C6-7, the C6-C7 disc protrusion does cause mild left foraminal stenosis at this level.  Impression and Recommendations:    Cervical spondylosis Multilevel cervical DDD. At this point has been unable to tolerate physical therapy due to pain. She does have a pain management provider and is on methadone. Declines neuropathic agents such as Cymbalta and gabapentin. I would like a cervical epidural, left C6-C7. She will  have this performed by Dr. Manon HildingBodea. ___________________________________________ Ihor Austinhomas J. Benjamin Stainhekkekandam, M.D., ABFM., CAQSM. Primary Care and Sports Medicine [facility]  Adjunct Professor of Family Medicine  [employer]

## 2018-04-28 NOTE — Assessment & Plan Note (Signed)
Multilevel cervical DDD. At this point has been unable to tolerate physical therapy due to pain. She does have a pain management provider and is on methadone. Declines neuropathic agents such as Cymbalta and gabapentin. I would like a cervical epidural, left C6-C7. She will have this performed by Dr. Manon HildingBodea.

## 2018-05-23 DIAGNOSIS — G894 Chronic pain syndrome: Secondary | ICD-10-CM | POA: Diagnosis not present

## 2018-05-23 DIAGNOSIS — G4700 Insomnia, unspecified: Secondary | ICD-10-CM | POA: Diagnosis not present

## 2018-05-23 DIAGNOSIS — M170 Bilateral primary osteoarthritis of knee: Secondary | ICD-10-CM | POA: Diagnosis not present

## 2018-05-23 DIAGNOSIS — M47816 Spondylosis without myelopathy or radiculopathy, lumbar region: Secondary | ICD-10-CM | POA: Diagnosis not present

## 2018-06-19 DIAGNOSIS — M170 Bilateral primary osteoarthritis of knee: Secondary | ICD-10-CM | POA: Diagnosis not present

## 2018-06-19 DIAGNOSIS — M47816 Spondylosis without myelopathy or radiculopathy, lumbar region: Secondary | ICD-10-CM | POA: Diagnosis not present

## 2018-06-19 DIAGNOSIS — G894 Chronic pain syndrome: Secondary | ICD-10-CM | POA: Diagnosis not present

## 2018-06-19 DIAGNOSIS — G4700 Insomnia, unspecified: Secondary | ICD-10-CM | POA: Diagnosis not present

## 2018-06-24 ENCOUNTER — Telehealth: Payer: Self-pay | Admitting: Family Medicine

## 2018-06-24 DIAGNOSIS — M47812 Spondylosis without myelopathy or radiculopathy, cervical region: Secondary | ICD-10-CM

## 2018-06-24 NOTE — Telephone Encounter (Signed)
She has a pain management provider, she was going to have her pain management doctor do this, if they are saying it is not approved then I am happy to try with sending her through [facility].  Let me know if she would be okay with this?

## 2018-06-24 NOTE — Telephone Encounter (Signed)
Patient states she wants to know the status of the referral to a specialist that does neck injections. She states that she last knew it was denied and has not heard anything else. Please advise.

## 2018-06-24 NOTE — Telephone Encounter (Signed)
This must have been ordered by pain management, will route to Provider who referred her there to see if he knows about update about insurance denial.

## 2018-06-25 NOTE — Telephone Encounter (Signed)
Spoke with Pt, she advised that Pain Management is refusing to order the injections so she wants to be referred to another pain management who will. Will route.

## 2018-06-25 NOTE — Telephone Encounter (Signed)
Lets just use [facility].  Ordering epidural.  Please contact them for scheduling.

## 2018-06-26 NOTE — Telephone Encounter (Signed)
Left VM for Carolyn Bauer with [facility]

## 2018-07-04 ENCOUNTER — Ambulatory Visit
Admission: RE | Admit: 2018-07-04 | Discharge: 2018-07-04 | Disposition: A | Discharge status: Home / Self Care | Payer: Medicare Other | Source: Ambulatory Visit | Attending: Sports Medicine | Admitting: Sports Medicine

## 2018-07-04 DIAGNOSIS — M47812 Spondylosis without myelopathy or radiculopathy, cervical region: Secondary | ICD-10-CM | POA: Diagnosis not present

## 2018-07-04 MED ORDER — TRIAMCINOLONE ACETONIDE 40 MG/ML IJ SUSP (RADIOLOGY)
60.0000 mg | Freq: Once | INTRAMUSCULAR | Status: AC
  Administered 2018-07-04: 60 mg via EPIDURAL

## 2018-07-04 MED ORDER — IOPAMIDOL (ISOVUE-M 300) INJECTION 61%
1.0000 mL | Freq: Once | INTRAMUSCULAR | Status: AC | PRN
  Administered 2018-07-04: 1 mL via EPIDURAL

## 2018-07-04 NOTE — Discharge Instructions (Signed)

## 2018-07-18 ENCOUNTER — Ambulatory Visit (INDEPENDENT_AMBULATORY_CARE_PROVIDER_SITE_OTHER): Payer: Medicare Other | Admitting: Sports Medicine

## 2018-07-18 ENCOUNTER — Encounter: Payer: Self-pay | Admitting: Sports Medicine

## 2018-07-18 DIAGNOSIS — M47812 Spondylosis without myelopathy or radiculopathy, cervical region: Secondary | ICD-10-CM | POA: Diagnosis not present

## 2018-07-18 NOTE — Assessment & Plan Note (Signed)
Multilevel cervical DDD, did extremely well after a left C7-T1 cervical epidural, it was ordered C6-C7 level however. She tells me her pain provider is not going to do any cervical epidurals. We did discuss the possibility of stacking the injections but her pain is so minimal now she feels that she can live with it. Return to see me as needed, she can call me for further injections if needed.

## 2018-07-18 NOTE — Progress Notes (Signed)
Subjective:    CC: Recheck neck after injection  HPI: This is a pleasant 58 year old female, she returns after a cervical epidural, she did extremely well and is happy with how things are going.  I reviewed the past medical history, family history, social history, surgical history, and allergies today and no changes were needed.  Please see the problem list section below in epic for further details.  Past Medical History: Past Medical History:  Diagnosis Date  . Fibromyalgia   . Thyroid disease    Past Surgical History: No past surgical history on file. Social History: Social History   Socioeconomic History  . Marital status: Divorced    Spouse name: Not on file  . Number of children: Not on file  . Years of education: Not on file  . Highest education level: Not on file  Occupational History  . Not on file  Social Needs  . Financial resource strain: Not on file  . Food insecurity:    Worry: Not on file    Inability: Not on file  . Transportation needs:    Medical: Not on file    Non-medical: Not on file  Tobacco Use  . Smoking status: Current Every Day Smoker    Packs/day: 1.00    Types: Cigarettes  . Smokeless tobacco: Never Used  Substance and Sexual Activity  . Alcohol use: No  . Drug use: Not on file  . Sexual activity: Not on file  Lifestyle  . Physical activity:    Days per week: Not on file    Minutes per session: Not on file  . Stress: Not on file  Relationships  . Social connections:    Talks on phone: Not on file    Gets together: Not on file    Attends religious service: Not on file    Active member of club or organization: Not on file    Attends meetings of clubs or organizations: Not on file    Relationship status: Not on file  Other Topics Concern  . Not on file  Social History Narrative  . Not on file   Family History: No family history on file. Allergies: Allergies  Allergen Reactions  . Acetaminophen-Codeine     REACTION: Rash  and itching   Medications: See med rec.  Review of Systems: No fevers, chills, night sweats, weight loss, chest pain, or shortness of breath.   Objective:    General: Well Developed, well nourished, and in no acute distress.  Neuro: Alert and oriented x3, extra-ocular muscles intact, sensation grossly intact.  HEENT: Normocephalic, atraumatic, pupils equal round reactive to light, neck supple, no masses, no lymphadenopathy, thyroid nonpalpable.  Skin: Warm and dry, no rashes. Cardiac: Regular rate and rhythm, no murmurs rubs or gallops, no lower extremity edema.  Respiratory: Clear to auscultation bilaterally. Not using accessory muscles, speaking in full sentences.  Impression and Recommendations:    Cervical spondylosis Multilevel cervical DDD, did extremely well after a left C7-T1 cervical epidural, it was ordered C6-C7 level however. She tells me her pain provider is not going to do any cervical epidurals. We did discuss the possibility of stacking the injections but her pain is so minimal now she feels that she can live with it. Return to see me as needed, she can call me for further injections if needed.  ___________________________________________ Ihor Austin. Benjamin Stain, M.D., ABFM., CAQSM. Primary Care and Sports Medicine [facility]  Adjunct Professor of Family Medicine  [employer]

## 2018-07-21 DIAGNOSIS — G894 Chronic pain syndrome: Secondary | ICD-10-CM | POA: Diagnosis not present

## 2018-07-21 DIAGNOSIS — G4700 Insomnia, unspecified: Secondary | ICD-10-CM | POA: Diagnosis not present

## 2018-07-21 DIAGNOSIS — M47816 Spondylosis without myelopathy or radiculopathy, lumbar region: Secondary | ICD-10-CM | POA: Diagnosis not present

## 2018-07-21 DIAGNOSIS — M170 Bilateral primary osteoarthritis of knee: Secondary | ICD-10-CM | POA: Diagnosis not present

## 2018-07-23 NOTE — Progress Notes (Deleted)
Subjective:   Carolyn Bauer is a 58 y.o. female who presents for an Initial Medicare Annual Wellness Visit.  Review of Systems    No ROS.  Medicare Wellness Visit. Additional risk factors are reflected in the social history.     Sleep patterns:   Home Safety/Smoke Alarms: Feels safe in home. Smoke alarms in place.  Living environment; Seat Belt Safety/Bike Helmet: Wears seat belt.   Female:   Pap-       Mammo-       Dexa scan- not eligible due to age       CCS-      Objective:    There were no vitals filed for this visit. There is no height or weight on file to calculate BMI.  No flowsheet data found.  Current Medications (verified) Outpatient Encounter Medications as of 07/28/2018  Medication Sig  . Hydrocodone-Acetaminophen (NORCO PO) Take by mouth.  . meloxicam (MOBIC) 15 MG tablet One tab PO qAM with breakfast for 2 weeks, then daily prn pain.  . methadone (DOLOPHINE) 10 MG tablet Take 10 mg by mouth every 8 (eight) hours.   No facility-administered encounter medications on file as of 07/28/2018.     Allergies (verified) Acetaminophen-codeine   History: Past Medical History:  Diagnosis Date  . Fibromyalgia   . Thyroid disease    No past surgical history on file. No family history on file. Social History   Socioeconomic History  . Marital status: Divorced    Spouse name: Not on file  . Number of children: Not on file  . Years of education: Not on file  . Highest education level: Not on file  Occupational History  . Not on file  Social Needs  . Financial resource strain: Not on file  . Food insecurity:    Worry: Not on file    Inability: Not on file  . Transportation needs:    Medical: Not on file    Non-medical: Not on file  Tobacco Use  . Smoking status: Current Every Day Smoker    Packs/day: 1.00    Types: Cigarettes  . Smokeless tobacco: Never Used  Substance and Sexual Activity  . Alcohol use: No  . Drug use: Not on file  . Sexual  activity: Not on file  Lifestyle  . Physical activity:    Days per week: Not on file    Minutes per session: Not on file  . Stress: Not on file  Relationships  . Social connections:    Talks on phone: Not on file    Gets together: Not on file    Attends religious service: Not on file    Active member of club or organization: Not on file    Attends meetings of clubs or organizations: Not on file    Relationship status: Not on file  Other Topics Concern  . Not on file  Social History Narrative  . Not on file    Tobacco Counseling Ready to quit: Not Answered Counseling given: Not Answered   Clinical Intake:                        Activities of Daily Living No flowsheet data found.   Immunizations and Health Maintenance  There is no immunization history on file for this patient. Health Maintenance Due  Topic Date Due  . Hepatitis C Screening  1960/12/16  . HIV Screening  06/20/1976  . TETANUS/TDAP  06/20/1980  . PAP  SMEAR-Modifier  06/20/1982  . MAMMOGRAM  06/21/2011  . COLONOSCOPY  06/21/2011    Patient Care Team: Agapito Games, MD as PCP - General  Indicate any recent Medical Services you may have received from other than [facility] providers in the past year (date may be approximate).     Assessment:   This is a routine wellness examination for Eniola.Physical assessment deferred to PCP.   Hearing/Vision screen No exam data present  Dietary issues and exercise activities discussed:   Diet  Breakfast: Lunch:  Dinner:       Goals   None    Depression Screen No flowsheet data found.  Fall Risk No flowsheet data found.  Is the patient's home free of loose throw rugs in walkways, pet beds, electrical cords, etc?   {Blank single:19197::"yes","no"}      Grab bars in the bathroom? {Blank single:19197::"yes","no"}      Handrails on the stairs?   {Blank single:19197::"yes","no"}      Adequate lighting?   {Blank  single:19197::"yes","no"}   Cognitive Function:        Screening Tests Health Maintenance  Topic Date Due  . Hepatitis C Screening  [birth year]  . HIV Screening  06/20/1976  . TETANUS/TDAP  06/20/1980  . PAP SMEAR-Modifier  06/20/1982  . MAMMOGRAM  06/21/2011  . COLONOSCOPY  06/21/2011  . INFLUENZA VACCINE  01/01/2019 (Originally 12/12/2017)       Plan:   ***  I have personally reviewed and noted the following in the patient's chart:   . Medical and social history . Use of alcohol, tobacco or illicit drugs  . Current medications and supplements . Functional ability and status . Nutritional status . Physical activity . Advanced directives . List of other physicians . Hospitalizations, surgeries, and ER visits in previous 12 months . Vitals . Screenings to include cognitive, depression, and falls . Referrals and appointments  In addition, I have reviewed and discussed with patient certain preventive protocols, quality metrics, and best practice recommendations. A written personalized care plan for preventive services as well as general preventive health recommendations were provided to patient.     Normand Sloop, LPN   1/69/4503

## 2018-07-28 ENCOUNTER — Ambulatory Visit: Payer: Medicare Other

## 2018-08-03 ENCOUNTER — Other Ambulatory Visit: Payer: Self-pay | Admitting: Sports Medicine

## 2018-08-03 DIAGNOSIS — M47812 Spondylosis without myelopathy or radiculopathy, cervical region: Secondary | ICD-10-CM

## 2018-08-18 DIAGNOSIS — G894 Chronic pain syndrome: Secondary | ICD-10-CM | POA: Diagnosis not present

## 2018-08-18 DIAGNOSIS — G4700 Insomnia, unspecified: Secondary | ICD-10-CM | POA: Diagnosis not present

## 2018-08-18 DIAGNOSIS — M545 Low back pain: Secondary | ICD-10-CM | POA: Diagnosis not present

## 2018-08-18 DIAGNOSIS — M47816 Spondylosis without myelopathy or radiculopathy, lumbar region: Secondary | ICD-10-CM | POA: Diagnosis not present

## 2018-08-18 DIAGNOSIS — M170 Bilateral primary osteoarthritis of knee: Secondary | ICD-10-CM | POA: Diagnosis not present

## 2018-09-16 DIAGNOSIS — G894 Chronic pain syndrome: Secondary | ICD-10-CM | POA: Diagnosis not present

## 2018-09-16 DIAGNOSIS — M47816 Spondylosis without myelopathy or radiculopathy, lumbar region: Secondary | ICD-10-CM | POA: Diagnosis not present

## 2018-09-16 DIAGNOSIS — M545 Low back pain: Secondary | ICD-10-CM | POA: Diagnosis not present

## 2018-09-16 DIAGNOSIS — G4700 Insomnia, unspecified: Secondary | ICD-10-CM | POA: Diagnosis not present

## 2018-09-16 DIAGNOSIS — M170 Bilateral primary osteoarthritis of knee: Secondary | ICD-10-CM | POA: Diagnosis not present

## 2018-10-07 ENCOUNTER — Telehealth: Payer: Self-pay | Admitting: Family Medicine

## 2018-10-07 DIAGNOSIS — M47812 Spondylosis without myelopathy or radiculopathy, cervical region: Secondary | ICD-10-CM

## 2018-10-07 NOTE — Telephone Encounter (Addendum)
Pt called. She wants to get another referral for injection in her neck.  Thanks

## 2018-10-07 NOTE — Telephone Encounter (Signed)
Msg left for Roberta to schedule

## 2018-10-07 NOTE — Telephone Encounter (Signed)
Epidural ordered, please contact [facility] for scheduling. 

## 2018-10-14 DIAGNOSIS — G4700 Insomnia, unspecified: Secondary | ICD-10-CM | POA: Diagnosis not present

## 2018-10-14 DIAGNOSIS — M170 Bilateral primary osteoarthritis of knee: Secondary | ICD-10-CM | POA: Diagnosis not present

## 2018-10-14 DIAGNOSIS — G894 Chronic pain syndrome: Secondary | ICD-10-CM | POA: Diagnosis not present

## 2018-10-14 DIAGNOSIS — M545 Low back pain: Secondary | ICD-10-CM | POA: Diagnosis not present

## 2018-10-14 DIAGNOSIS — M47816 Spondylosis without myelopathy or radiculopathy, lumbar region: Secondary | ICD-10-CM | POA: Diagnosis not present

## 2018-10-17 ENCOUNTER — Other Ambulatory Visit: Payer: Medicare Other

## 2018-11-07 ENCOUNTER — Other Ambulatory Visit: Payer: Self-pay

## 2018-11-07 ENCOUNTER — Ambulatory Visit
Admission: RE | Admit: 2018-11-07 | Discharge: 2018-11-07 | Disposition: A | Discharge status: Home / Self Care | Payer: Medicare Other | Source: Ambulatory Visit | Attending: Sports Medicine | Admitting: Sports Medicine

## 2018-11-07 DIAGNOSIS — M47812 Spondylosis without myelopathy or radiculopathy, cervical region: Secondary | ICD-10-CM | POA: Diagnosis not present

## 2018-11-07 MED ORDER — TRIAMCINOLONE ACETONIDE 40 MG/ML IJ SUSP (RADIOLOGY)
60.0000 mg | Freq: Once | INTRAMUSCULAR | Status: AC
  Administered 2018-11-07: 15:00:00 60 mg via EPIDURAL

## 2018-11-07 MED ORDER — IOPAMIDOL (ISOVUE-M 300) INJECTION 61%
1.0000 mL | Freq: Once | INTRAMUSCULAR | Status: AC
  Administered 2018-11-07: 15:00:00 1 mL via EPIDURAL

## 2018-11-07 NOTE — Discharge Instructions (Signed)

## 2018-11-13 DIAGNOSIS — Z79891 Long term (current) use of opiate analgesic: Secondary | ICD-10-CM | POA: Diagnosis not present

## 2018-11-13 DIAGNOSIS — M47816 Spondylosis without myelopathy or radiculopathy, lumbar region: Secondary | ICD-10-CM | POA: Diagnosis not present

## 2018-11-13 DIAGNOSIS — G4700 Insomnia, unspecified: Secondary | ICD-10-CM | POA: Diagnosis not present

## 2018-11-13 DIAGNOSIS — G894 Chronic pain syndrome: Secondary | ICD-10-CM | POA: Diagnosis not present

## 2018-11-13 DIAGNOSIS — M170 Bilateral primary osteoarthritis of knee: Secondary | ICD-10-CM | POA: Diagnosis not present

## 2018-12-15 DIAGNOSIS — M170 Bilateral primary osteoarthritis of knee: Secondary | ICD-10-CM | POA: Diagnosis not present

## 2018-12-15 DIAGNOSIS — M47816 Spondylosis without myelopathy or radiculopathy, lumbar region: Secondary | ICD-10-CM | POA: Diagnosis not present

## 2018-12-15 DIAGNOSIS — G894 Chronic pain syndrome: Secondary | ICD-10-CM | POA: Diagnosis not present

## 2018-12-15 DIAGNOSIS — G4700 Insomnia, unspecified: Secondary | ICD-10-CM | POA: Diagnosis not present

## 2019-01-10 ENCOUNTER — Other Ambulatory Visit: Payer: Self-pay | Admitting: Sports Medicine

## 2019-01-10 DIAGNOSIS — M47812 Spondylosis without myelopathy or radiculopathy, cervical region: Secondary | ICD-10-CM

## 2019-01-14 DIAGNOSIS — G4700 Insomnia, unspecified: Secondary | ICD-10-CM | POA: Diagnosis not present

## 2019-01-14 DIAGNOSIS — M170 Bilateral primary osteoarthritis of knee: Secondary | ICD-10-CM | POA: Diagnosis not present

## 2019-01-14 DIAGNOSIS — G894 Chronic pain syndrome: Secondary | ICD-10-CM | POA: Diagnosis not present

## 2019-01-14 DIAGNOSIS — M47816 Spondylosis without myelopathy or radiculopathy, lumbar region: Secondary | ICD-10-CM | POA: Diagnosis not present

## 2019-02-09 DIAGNOSIS — M47816 Spondylosis without myelopathy or radiculopathy, lumbar region: Secondary | ICD-10-CM | POA: Diagnosis not present

## 2019-02-09 DIAGNOSIS — G4700 Insomnia, unspecified: Secondary | ICD-10-CM | POA: Diagnosis not present

## 2019-02-09 DIAGNOSIS — G894 Chronic pain syndrome: Secondary | ICD-10-CM | POA: Diagnosis not present

## 2019-02-09 DIAGNOSIS — M170 Bilateral primary osteoarthritis of knee: Secondary | ICD-10-CM | POA: Diagnosis not present

## 2019-02-10 IMAGING — MR MR LUMBAR SPINE W/O CM
4 of 5 series · 26 of 48 positions shown · non-contrast
Comparison: None.

CLINICAL DATA: Chronic progressive low back pain and left leg pain.
Weakness.

EXAM:
MRI LUMBAR SPINE WITHOUT CONTRAST
TECHNIQUE: Multiplanar, multisequence MR imaging of the lumbar spine was
performed. No intravenous contrast was administered.

[Series 2: T2 · sagittal · 4.0mm · 0.81mm/px · 6 of 15 slices shown (1 of 2)]
[im 1/15]
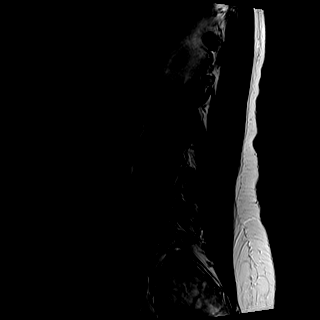
[im 3/15]
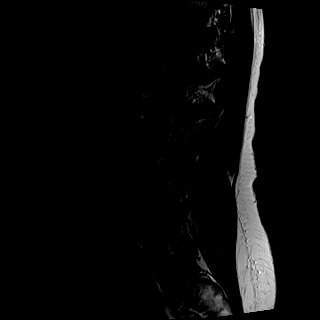
[im 6/15]
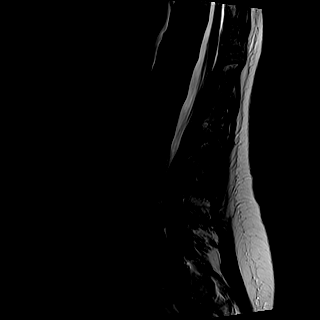
[im 9/15]
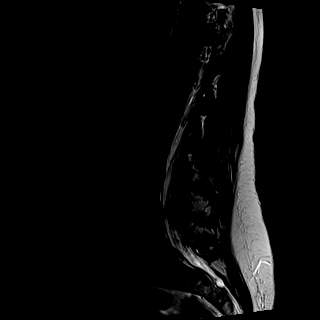
[im 12/15]
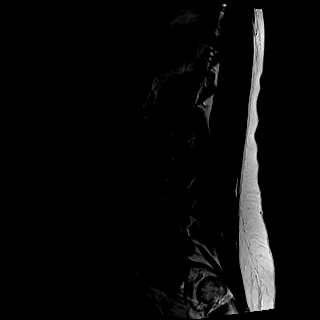
[im 15/15]
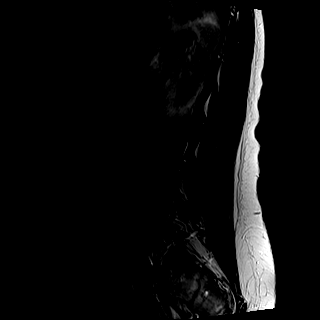

[Series 3: T1 · sagittal · 4.0mm · 0.41mm/px · 6 of 15 slices shown (1 of 2)]
[im 1/15]
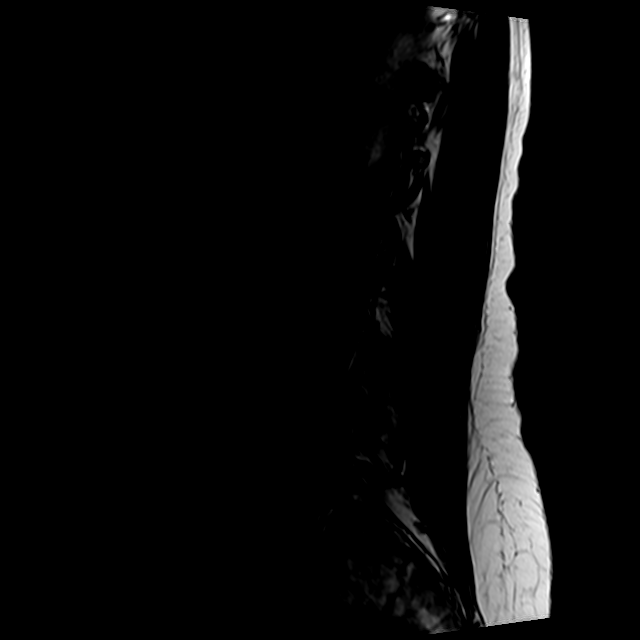
[im 3/15]
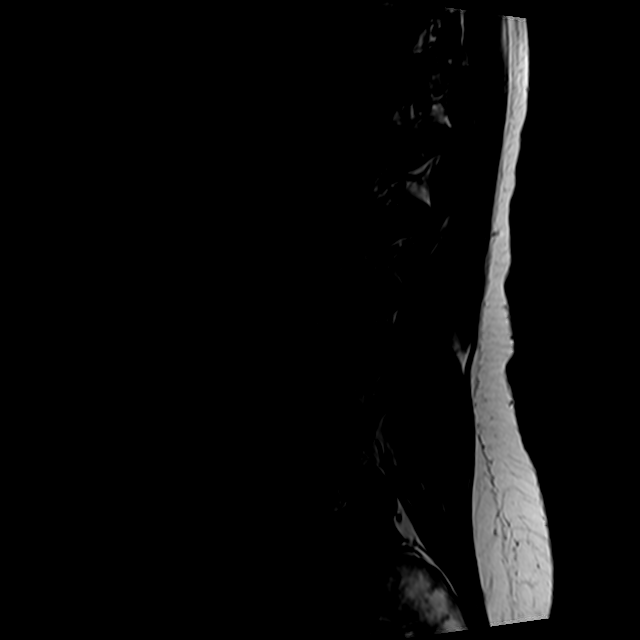
[im 6/15]
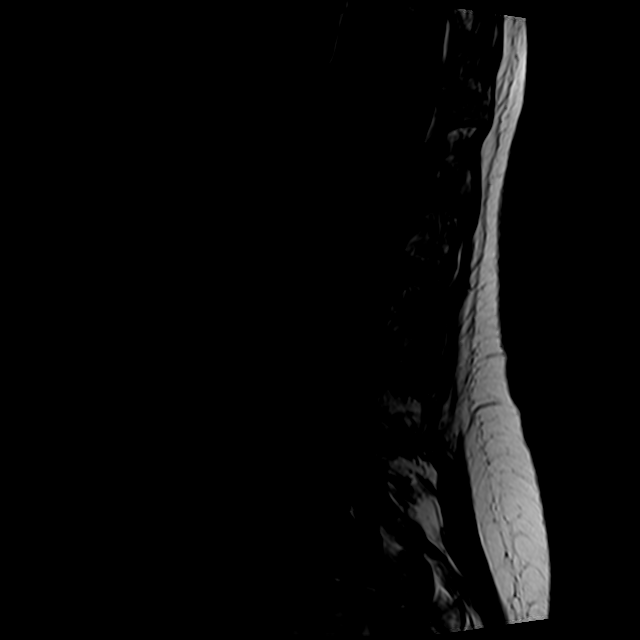
[im 9/15]
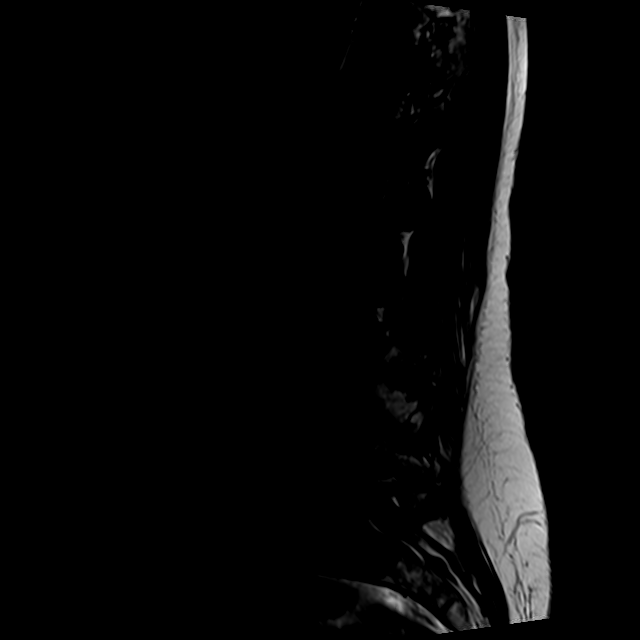
[im 12/15]
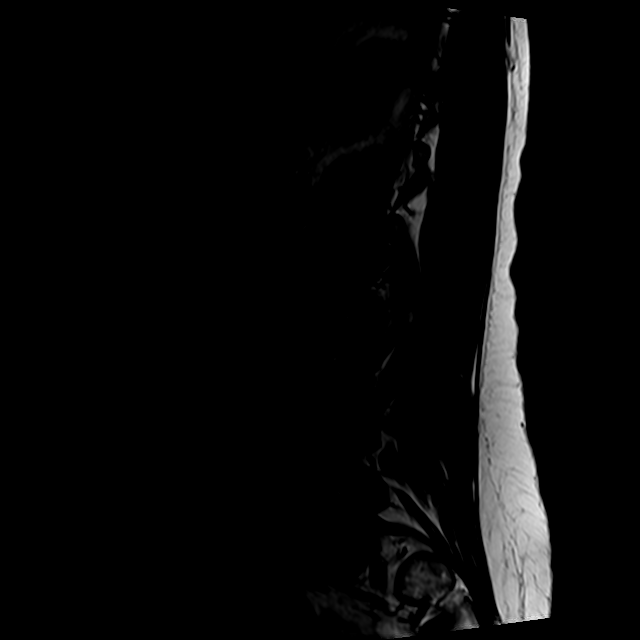
[im 15/15]
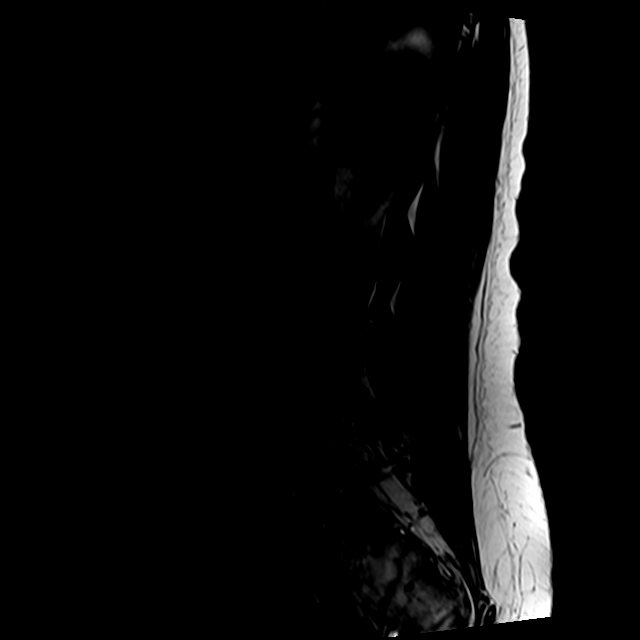

[Series 5: T2 · axial · 4.0mm · 0.78mm/px · z∈[-116,+115]mm · 9 of 41 slices shown (2 of 2)]
[im 1/41]
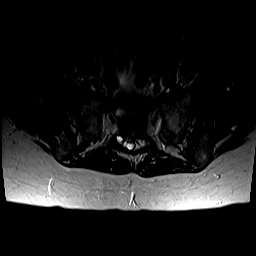
[im 6/41]
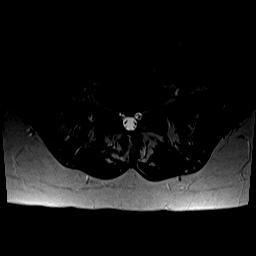
[im 12/41]
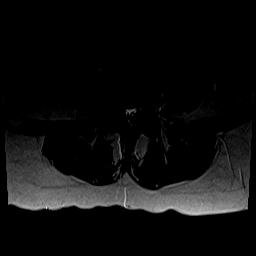
[im 18/41]
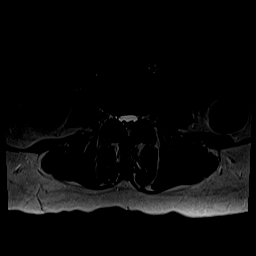
[im 21/41]
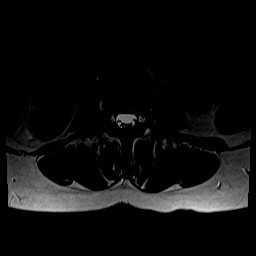
[im 23/41]
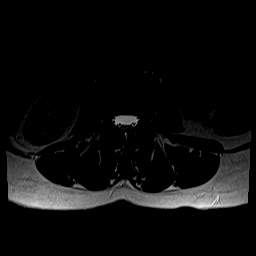
[im 29/41]
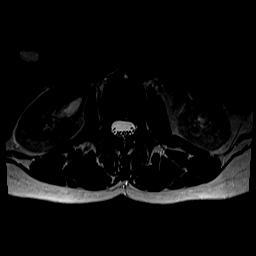
[im 35/41]
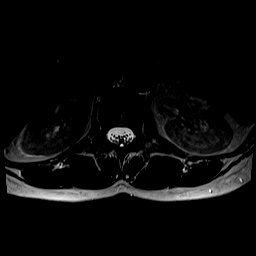
[im 41/41]
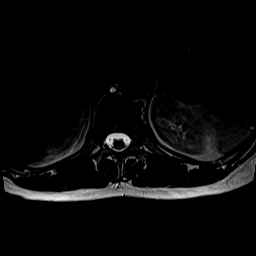

[Series 6: T1 · axial · 4.0mm · 0.39mm/px · z∈[-116,+85]mm · 5 of 41 slices shown (2 of 2)]
[im 1/41]
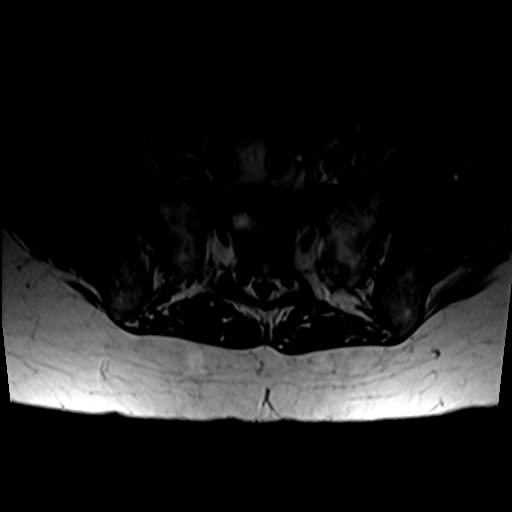
[im 6/41]
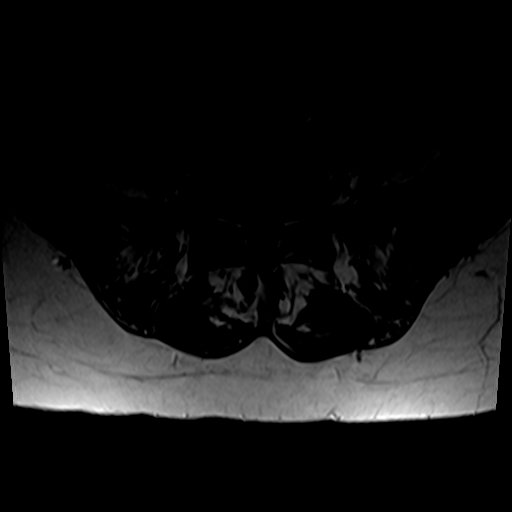
[im 12/41]
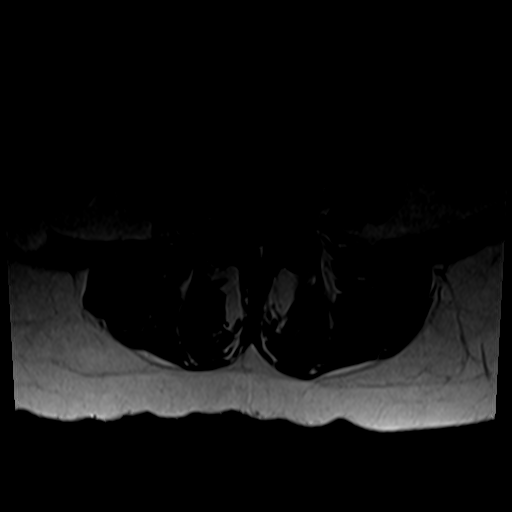
[im 21/41]
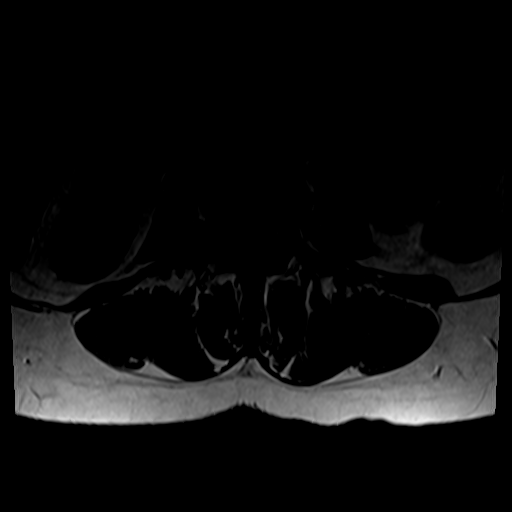
[im 35/41]
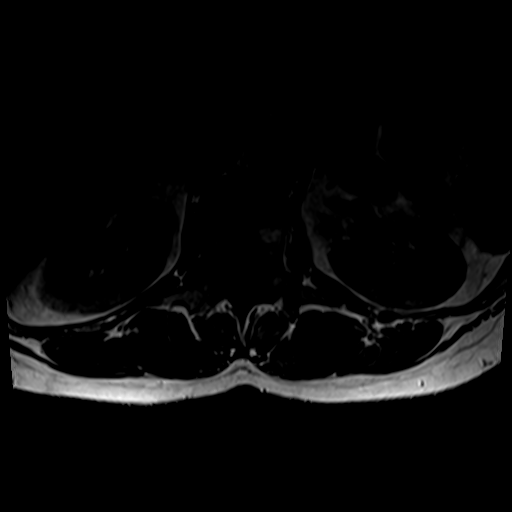

[26 of 48 positions shown; findings below may reference images not displayed]

FINDINGS: Segmentation:  Standard.

Alignment:  Physiologic.

Vertebrae:  No fracture, evidence of discitis, or bone lesion.

Conus medullaris: Extends to the L1-2 level and appears normal.

Paraspinal and other soft tissues: Negative.

Disc levels:

T12-L1 through L2-3:  Normal.

L3-4:  Tiny broad-based disc bulge with no neural impingement.

L4-5: Soft disc protrusion foraminal and extraforaminal on the
right. This could irritate the right L4 nerve. Minimal bulge of the
remainder of the disc. Hypertrophy of the ligamentum flavum facet
joints, right greater than left. Combine with the disc bulge this
creates moderately severe spinal stenosis and bilateral lateral
recess stenosis, right greater than left.

L5-S1: Small central disc bulge with no neural impingement. Slight
hypertrophy of the facet joints. Slight left foraminal narrowing.
The left L5 nerve appears to exit without impingement.
IMPRESSION: 1. Moderately severe spinal stenosis and bilateral lateral recess
stenosis at L4-5.
2. Right foraminal and extraforaminal disc protrusion at L4-5 which
could irritate the right L4 nerve.
3. Slight left foraminal narrowing at L5-S1.

## 2019-03-09 DIAGNOSIS — G4700 Insomnia, unspecified: Secondary | ICD-10-CM | POA: Diagnosis not present

## 2019-03-09 DIAGNOSIS — M170 Bilateral primary osteoarthritis of knee: Secondary | ICD-10-CM | POA: Diagnosis not present

## 2019-03-09 DIAGNOSIS — G894 Chronic pain syndrome: Secondary | ICD-10-CM | POA: Diagnosis not present

## 2019-03-09 DIAGNOSIS — M47816 Spondylosis without myelopathy or radiculopathy, lumbar region: Secondary | ICD-10-CM | POA: Diagnosis not present

## 2019-04-06 DIAGNOSIS — G894 Chronic pain syndrome: Secondary | ICD-10-CM | POA: Diagnosis not present

## 2019-04-06 DIAGNOSIS — M170 Bilateral primary osteoarthritis of knee: Secondary | ICD-10-CM | POA: Diagnosis not present

## 2019-04-06 DIAGNOSIS — M47816 Spondylosis without myelopathy or radiculopathy, lumbar region: Secondary | ICD-10-CM | POA: Diagnosis not present

## 2019-04-06 DIAGNOSIS — G4700 Insomnia, unspecified: Secondary | ICD-10-CM | POA: Diagnosis not present

## 2019-04-30 ENCOUNTER — Other Ambulatory Visit: Payer: Self-pay | Admitting: *Deleted

## 2019-04-30 DIAGNOSIS — M47812 Spondylosis without myelopathy or radiculopathy, cervical region: Secondary | ICD-10-CM

## 2019-04-30 MED ORDER — MELOXICAM 15 MG PO TABS: ORAL_TABLET | ORAL | 3 refills | Status: DC

## 2019-05-04 DIAGNOSIS — G894 Chronic pain syndrome: Secondary | ICD-10-CM | POA: Diagnosis not present

## 2019-05-04 DIAGNOSIS — G4700 Insomnia, unspecified: Secondary | ICD-10-CM | POA: Diagnosis not present

## 2019-05-04 DIAGNOSIS — M170 Bilateral primary osteoarthritis of knee: Secondary | ICD-10-CM | POA: Diagnosis not present

## 2019-05-04 DIAGNOSIS — M47816 Spondylosis without myelopathy or radiculopathy, lumbar region: Secondary | ICD-10-CM | POA: Diagnosis not present

## 2019-06-23 DIAGNOSIS — G894 Chronic pain syndrome: Secondary | ICD-10-CM | POA: Diagnosis not present

## 2019-06-23 DIAGNOSIS — G4700 Insomnia, unspecified: Secondary | ICD-10-CM | POA: Diagnosis not present

## 2019-06-23 DIAGNOSIS — M170 Bilateral primary osteoarthritis of knee: Secondary | ICD-10-CM | POA: Diagnosis not present

## 2019-06-23 DIAGNOSIS — M47816 Spondylosis without myelopathy or radiculopathy, lumbar region: Secondary | ICD-10-CM | POA: Diagnosis not present

## 2019-07-22 DIAGNOSIS — G894 Chronic pain syndrome: Secondary | ICD-10-CM | POA: Diagnosis not present

## 2019-07-22 DIAGNOSIS — M170 Bilateral primary osteoarthritis of knee: Secondary | ICD-10-CM | POA: Diagnosis not present

## 2019-07-22 DIAGNOSIS — M47816 Spondylosis without myelopathy or radiculopathy, lumbar region: Secondary | ICD-10-CM | POA: Diagnosis not present

## 2019-07-22 DIAGNOSIS — G4700 Insomnia, unspecified: Secondary | ICD-10-CM | POA: Diagnosis not present

## 2019-08-24 DIAGNOSIS — Z79891 Long term (current) use of opiate analgesic: Secondary | ICD-10-CM | POA: Diagnosis not present

## 2019-08-24 DIAGNOSIS — M47816 Spondylosis without myelopathy or radiculopathy, lumbar region: Secondary | ICD-10-CM | POA: Diagnosis not present

## 2019-08-24 DIAGNOSIS — G4700 Insomnia, unspecified: Secondary | ICD-10-CM | POA: Diagnosis not present

## 2019-08-24 DIAGNOSIS — G894 Chronic pain syndrome: Secondary | ICD-10-CM | POA: Diagnosis not present

## 2019-08-24 DIAGNOSIS — M170 Bilateral primary osteoarthritis of knee: Secondary | ICD-10-CM | POA: Diagnosis not present

## 2019-09-10 ENCOUNTER — Emergency Department (INDEPENDENT_AMBULATORY_CARE_PROVIDER_SITE_OTHER)
Admission: EM | Admit: 2019-09-10 | Discharge: 2019-09-10 | Disposition: A | Discharge status: Home / Self Care | Payer: Medicare Other | Source: Home / Self Care | Attending: Family Medicine | Admitting: Family Medicine

## 2019-09-10 ENCOUNTER — Emergency Department (INDEPENDENT_AMBULATORY_CARE_PROVIDER_SITE_OTHER): Payer: Medicare Other

## 2019-09-10 ENCOUNTER — Other Ambulatory Visit: Payer: Self-pay

## 2019-09-10 ENCOUNTER — Encounter: Payer: Self-pay | Admitting: Emergency Medicine

## 2019-09-10 DIAGNOSIS — M25572 Pain in left ankle and joints of left foot: Secondary | ICD-10-CM | POA: Diagnosis not present

## 2019-09-10 DIAGNOSIS — M76822 Posterior tibial tendinitis, left leg: Secondary | ICD-10-CM

## 2019-09-10 DIAGNOSIS — M778 Other enthesopathies, not elsewhere classified: Secondary | ICD-10-CM

## 2019-09-10 DIAGNOSIS — M7672 Peroneal tendinitis, left leg: Secondary | ICD-10-CM

## 2019-09-10 NOTE — ED Provider Notes (Signed)
[facility]    CSN: [id number] Arrival date & time: 09/10/19  1639      History   Chief Complaint Chief Complaint  Patient presents with  . Ankle Pain    HPI Carolyn Bauer is a 59 y.o. female.   Patient has a history of chronic low back pain with radiculopathy.  Four days ago while climbing out of bed, she felt pain in the dorsum of her left foot, and pain in her left ankle bilaterally.  The pain has persisted.  She recalls no injury or change in activities. Patient notes that she has an appointment with Dr. Aundria Mems in four days.  The history is provided by the patient.  Ankle Pain Location:  Ankle and foot Time since incident:  4 days Injury: no   Ankle location:  L ankle Foot location:  L foot Pain details:    Quality:  Aching   Radiates to:  Does not radiate   Severity:  Moderate   Onset quality:  Sudden   Duration:  4 days   Timing:  Constant   Progression:  Unchanged Chronicity:  New Prior injury to area:  No Relieved by:  Nothing Worsened by:  Bearing weight Ineffective treatments:  None tried Associated symptoms: decreased ROM and stiffness   Associated symptoms: no numbness, no swelling and no tingling     Past Medical History:  Diagnosis Date  . Fibromyalgia   . Thyroid disease     Patient Active Problem List   Diagnosis Date Noted  . Cervical spondylosis 02/14/2018  . IRREGULAR MENSTRUATION 12/23/2006  . HIP PAIN, RIGHT 12/23/2006  . GOITER NOS 02/19/2006  . TOBACCO DEPENDENCE 02/19/2006  . MENOPAUSAL SYNDROME 02/19/2006  . MYALGIA/MYOSITIS, UNSPECIFIED 02/19/2006    History reviewed. No pertinent surgical history.  OB History   No obstetric history on file.      Home Medications    Prior to Admission medications   Medication Sig Start Date End Date Taking? Authorizing Provider  Hydrocodone-Acetaminophen (NORCO PO) Take by mouth.    [provider]  methadone (DOLOPHINE) 10 MG tablet Take 10 mg by  mouth every 8 (eight) hours.    [provider]    Family History Family History  Problem Relation Age of Onset  . Heart attack Mother   . Kidney failure Mother   . Healthy Father     Social History Social History   Tobacco Use  . Smoking status: Current Every Day Smoker    Packs/day: 1.00    Types: Cigarettes  . Smokeless tobacco: Never Used  Substance Use Topics  . Alcohol use: No  . Drug use: Not on file     Allergies   Acetaminophen-codeine   Review of Systems Review of Systems  Musculoskeletal: Positive for stiffness.  All other systems reviewed and are negative.    Physical Exam Triage Vital Signs ED Triage Vitals  Enc Vitals Group     BP 09/10/19 1658 122/79     Pulse Rate 09/10/19 1658 72     Resp --      Temp 09/10/19 1658 99.4 F (37.4 C)     Temp Source 09/10/19 1658 Oral     SpO2 09/10/19 1658 97 %     Weight 09/10/19 1700 125 lb (56.7 kg)     Height 09/10/19 1700 5\' 2"  (1.575 m)     Head Circumference --      Peak Flow --      Pain  Score 09/10/19 1659 4     Pain Loc --      Pain Edu? --      Excl. in GC? --    No data found.  Updated Vital Signs BP 122/79 (BP Location: Right Arm)   Pulse 72   Temp 99.4 F (37.4 C) (Oral)   Ht 5\' 2"  (1.575 m)   Wt 56.7 kg   SpO2 97%   BMI 22.86 kg/m   Visual Acuity Right Eye Distance:   Left Eye Distance:   Bilateral Distance:    Right Eye Near:   Left Eye Near:    Bilateral Near:     Physical Exam Vitals and nursing note reviewed.  Constitutional:      General: She is not in acute distress. HENT:     Head: Normocephalic.  Eyes:     Pupils: Pupils are equal, round, and reactive to light.  Cardiovascular:     Rate and Rhythm: Normal rate.  Pulmonary:     Effort: Pulmonary effort is normal.  Musculoskeletal:     Cervical back: Normal range of motion.     Left ankle: No swelling. Decreased range of motion.     Left foot: Decreased range of motion. Normal capillary refill.  Tenderness and bony tenderness present. No swelling, laceration or crepitus.       Legs:       Feet:     Comments: Left foot has tenderness to palpation over extensor tendons dorsally. Pain is elicited when palpating extensor tendons during resisted dorsiflexion.  Left foot has tenderness to palpation over posterior tibial tendon.  Pain elicited by resisted plantar flexion and resisted inversion of the ankle.  There is also tenderness over the course of the left peroneal tendon but not as pronounced as the opposite side.  Pain is elicited with resisted eversion and resisted plantar flexion of the ankle.  Distal neurovascular function is intact.    Skin:    General: Skin is warm and dry.  Neurological:     Mental Status: She is alert.      UC Treatments / Results  Labs (all labs ordered are listed, but only abnormal results are displayed) Labs Reviewed - No data to display  EKG   Radiology DG Ankle Complete Left  Result Date: 09/10/2019 CLINICAL DATA:  Left ankle pain for 2 days. No known injury. EXAM: LEFT ANKLE COMPLETE - 3+ VIEW COMPARISON:  None. FINDINGS: There is no evidence of fracture, dislocation, or joint effusion. There is a plantar calcaneal spur and Achilles tendon enthesophyte. Mild talonavicular spurring. Enthesopathic change versus accessory ossicle adjacent to the medial malleolus, chronic and of doubtful clinical significance. Soft tissues are unremarkable. IMPRESSION: 1. No acute osseous abnormality. 2. Plantar calcaneal spur and Achilles tendon enthesophyte. Mild talonavicular degenerative change. Electronically Signed   By: 09/12/2019 M.D.   On: 09/10/2019 17:36    Procedures Procedures (including critical care time)  Medications Ordered in UC Medications - No data to display  Initial Impression / Assessment and Plan / UC Course  I have reviewed the triage vital signs and the nursing notes.  Pertinent labs & imaging results that were available during  my care of the patient were reviewed by me and considered in my medical decision making (see chart for details).    Dispensed AirCast splint.   Given treatment instructions with range of motion and stretching exercises. Followup with Dr. 09/12/2019 ([facility]) in 4 days as scheduled.  Final Clinical Impressions(s) / UC Diagnoses   Final diagnoses:  Posterior tibial tendonitis, left  Peroneal tendonitis of left lower extremity  Extensor tendonitis of foot     Discharge Instructions     Wear AirCast splint.  Take Ibuprofen 200mg , 4 tabs every 8 hours with food.  Apply ice pack for 20 to 30 minutes, 3 to 4 times daily  Continue until pain and swelling decrease.  Begin stretching exercises as tolerated.    ED Prescriptions    None        , MD 09/11/19 1300

## 2019-09-10 NOTE — Discharge Instructions (Addendum)
Wear AirCast splint.  Take Ibuprofen 200mg , 4 tabs every 8 hours with food.  Apply ice pack for 20 to 30 minutes, 3 to 4 times daily  Continue until pain and swelling decrease.  Begin stretching exercises as tolerated.

## 2019-09-10 NOTE — ED Triage Notes (Signed)
LT ankle pain x 4 days Sciatica has disc problems L4,5

## 2019-09-14 ENCOUNTER — Ambulatory Visit: Payer: Medicare Other | Admitting: Family Medicine

## 2019-09-18 ENCOUNTER — Ambulatory Visit (INDEPENDENT_AMBULATORY_CARE_PROVIDER_SITE_OTHER): Payer: Medicare Other | Admitting: Family Medicine

## 2019-09-18 ENCOUNTER — Other Ambulatory Visit: Payer: Self-pay

## 2019-09-18 ENCOUNTER — Encounter: Payer: Self-pay | Admitting: Family Medicine

## 2019-09-18 VITALS — BP 139/78 | HR 51 | Ht 62.0 in | Wt 143.0 lb

## 2019-09-18 DIAGNOSIS — R0789 Other chest pain: Secondary | ICD-10-CM

## 2019-09-18 DIAGNOSIS — E049 Nontoxic goiter, unspecified: Secondary | ICD-10-CM

## 2019-09-18 DIAGNOSIS — M25572 Pain in left ankle and joints of left foot: Secondary | ICD-10-CM

## 2019-09-18 DIAGNOSIS — M48061 Spinal stenosis, lumbar region without neurogenic claudication: Secondary | ICD-10-CM | POA: Diagnosis not present

## 2019-09-18 DIAGNOSIS — M5127 Other intervertebral disc displacement, lumbosacral region: Secondary | ICD-10-CM | POA: Diagnosis not present

## 2019-09-18 DIAGNOSIS — R635 Abnormal weight gain: Secondary | ICD-10-CM | POA: Diagnosis not present

## 2019-09-18 DIAGNOSIS — M79662 Pain in left lower leg: Secondary | ICD-10-CM

## 2019-09-18 DIAGNOSIS — M47816 Spondylosis without myelopathy or radiculopathy, lumbar region: Secondary | ICD-10-CM | POA: Diagnosis not present

## 2019-09-18 DIAGNOSIS — M79661 Pain in right lower leg: Secondary | ICD-10-CM

## 2019-09-18 MED ORDER — OMEPRAZOLE 40 MG PO CPDR: 40.0000 mg | DELAYED_RELEASE_CAPSULE | Freq: Every day | ORAL | 0 refills | Status: DC

## 2019-09-18 NOTE — Progress Notes (Addendum)
Established Patient Office Visit  Subjective:  Patient ID: Carolyn Bauer, female    DOB: Jan 04, 1961  Age: 59 y.o. MRN: [id number]  CC:  Chief Complaint  Patient presents with  . Chest Pain   HPI AKYA FIORELLO presents for chest pain on and off x 3 weeks. Last 30 sec to 2 min. Not tried anything. Thought is was gas as gets relief with belching.  NO radiation of pain. Feels like a "pinching" sensation.  She did not try taking any reflux medication etc.  She feels like her bowels have been moving normally she denies feeling constipated.  She does not notice any worsening after eating.  She has noted a decreased appetite and some concerned because she actually has gained weight over this last year.  She denies any blood in the urine or stool.  She also complains of left ankle pain mostly over the anterior part of the ankle she denies any injury or trauma says she just woke up with it 1 day she says is been really sore for about a week and a half.  She says ibuprofen does not really help in fact she says she went to the emergency department for it.  Also complains of bilateral calf pain that she wakes up with.  Notices it sometimes with increased walking.  She denies any muscle cramping.  Usually when she gets walking after she first gets up it feels a little better and eases off.      Past Medical History:  Diagnosis Date  . Fibromyalgia   . Thyroid disease     History reviewed. No pertinent surgical history.  Family History  Problem Relation Age of Onset  . Heart attack Mother   . Kidney failure Mother   . Healthy Father     Social History   Socioeconomic History  . Marital status: Divorced    Spouse name: Not on file  . Number of children: Not on file  . Years of education: Not on file  . Highest education level: Not on file  Occupational History  . Not on file  Tobacco Use  . Smoking status: Current Every Day Smoker    Packs/day: 1.00    Types: Cigarettes  . Smokeless  tobacco: Never Used  Substance and Sexual Activity  . Alcohol use: No  . Drug use: Not on file  . Sexual activity: Not on file  Other Topics Concern  . Not on file  Social History Narrative  . Not on file   Social Determinants of Health   Financial Resource Strain:   . Difficulty of Paying Living Expenses:   Food Insecurity:   . Worried About Charity fundraiser in the Last Year:   . Arboriculturist in the Last Year:   Transportation Needs:   . Film/video editor (Medical):   Marland Kitchen Lack of Transportation (Non-Medical):   Physical Activity:   . Days of Exercise per Week:   . Minutes of Exercise per Session:   Stress:   . Feeling of Stress :   Social Connections:   . Frequency of Communication with Friends and Family:   . Frequency of Social Gatherings with Friends and Family:   . Attends Religious Services:   . Active Member of Clubs or Organizations:   . Attends Archivist Meetings:   Marland Kitchen Marital Status:   Intimate Partner Violence:   . Fear of Current or Ex-Partner:   . Emotionally Abused:   .  Physically Abused:   . Sexually Abused:     Outpatient Medications Prior to Visit  Medication Sig Dispense Refill  . Oxycodone HCl 10 MG TABS Take 10 mg by mouth every 6 (six) hours as needed.    . zolpidem (AMBIEN) 10 MG tablet Take 10 mg by mouth at bedtime as needed.    . methadone (DOLOPHINE) 10 MG tablet Take 10 mg by mouth every 8 (eight) hours.    . Hydrocodone-Acetaminophen (NORCO PO) Take by mouth.     No facility-administered medications prior to visit.    Allergies  Allergen Reactions  . Morphine And Related Itching  . Acetaminophen-Codeine     REACTION: Rash and itching    ROS Review of Systems    Objective:    Physical Exam  Constitutional: She is oriented to person, place, and time. She appears well-developed and well-nourished.  HENT:  Head: Normocephalic and atraumatic.  Eyes: Conjunctivae are normal.  Neck:  She has a fairly large  right-sided goiter on exam.  Cardiovascular: Normal rate, regular rhythm and normal heart sounds.  Pulmonary/Chest: Effort normal and breath sounds normal.  Musculoskeletal:     Comments: No swelling or erythema over the ankles.  Pain with dorsiflexion of the ankle at the crease.  Neurological: She is alert and oriented to person, place, and time.  Skin: Skin is warm and dry.  Psychiatric: She has a normal mood and affect. Her behavior is normal.    BP 139/78   Pulse (!) 51   Ht '5\' 2"'$  (1.575 m)   Wt 143 lb (64.9 kg)   SpO2 98%   BMI 26.16 kg/m  Wt Readings from Last 3 Encounters:  09/18/19 143 lb (64.9 kg)  09/10/19 125 lb (56.7 kg)  07/18/18 127 lb (57.6 kg)     There are no preventive care reminders to display for this patient.  There are no preventive care reminders to display for this patient.  No results found for: TSH No results found for: WBC, HGB, HCT, MCV, PLT No results found for: NA, K, CHLORIDE, CO2, GLUCOSE, BUN, CREATININE, BILITOT, ALKPHOS, AST, ALT, PROT, ALBUMIN, CALCIUM, ANIONGAP, EGFR, GFR No results found for: CHOL No results found for: HDL No results found for: LDLCALC No results found for: TRIG No results found for: CHOLHDL No results found for: HGBA1C    Assessment & Plan:   Problem List Items Addressed This Visit    None    Visit Diagnoses    Abnormal weight gain    -  Primary   Relevant Orders   CBC with Differential/Platelet   TSH + free T4   COMPLETE METABOLIC PANEL WITH GFR   Acute left ankle pain       Goiter       Relevant Orders   CBC with Differential/Platelet   TSH + free T4   COMPLETE METABOLIC PANEL WITH GFR   Atypical chest pain       Relevant Orders   CBC with Differential/Platelet   TSH + free T4   COMPLETE METABOLIC PANEL WITH GFR   EKG 12-Lead   Bilateral calf pain       Relevant Orders   VAS [place] ABI WITH/WO TBI     Atypical chest pain-it does not sound cardiac.  Did give her reassurance but will go ahead and do  EKG today as well as evaluate for anemia and thyroid disorder.  EKG shows rate of 56 bpm with sinus arrhythmia.  Bradycardia.  Otherwise no  acute ST-T wave changes.  Recommend a trial of a PPI as it could be GERD or esophagitis related.  Abnormal weight gain-reports increased weight even though she is actually had a significantly decreased appetite.  She does have a border on exam and says he has had multiple biopsies in the past but the last one being probably 15 years ago.  We will recheck TSH today as well as free T4  Bilateral calf pain-she says it mostly occurs when she first wakes up in the morning and then with increased walking.  Will check for peripheral vascular disease with ABIs.  Also discussed that it could be coming from her spinal issues as well especially since she wakes up with it.  It does not sound like cramping.  Left ankle pain-recommend continue with icing, anti-inflammatory, and rehab stretches over the next week and a half at that point if it is not improving then recommend that she follow-up with our sports medicine doc, Dr. Dianah Field.  Do not appreciate any swelling on exam today.  No rash or erythema or heat.    Meds ordered this encounter  Medications  . omeprazole (PRILOSEC) 40 MG capsule    Sig: Take 1 capsule (40 mg total) by mouth daily.    Dispense:  30 capsule    Refill:  0    Follow-up: Return if symptoms worsen or fail to improve.    Beatrice Lecher, MD

## 2019-09-18 NOTE — Progress Notes (Signed)
Pt reports on and off chest pain that feels like "pinches" all over her chest. She said that it lasts for about 30 seconds up to about 4 minutes it doesn't radiate and she is not having any SOB. She does not have a Hx of heart issues but stated that her mother had 2 HA before she passed last May because she decided to d/c dialysis.

## 2019-09-18 NOTE — Patient Instructions (Signed)
Anterior Ankle Impingement Rehab Ask your health care provider which exercises are safe for you. Do exercises exactly as told by your health care provider and adjust them as directed. It is normal to feel mild stretching, pulling, tightness, or discomfort as you do these exercises. Stop right away if you feel sudden pain or your pain gets worse. Do not begin these exercises until told by your health care provider. Stretching and range-of-motion exercise Ankle alphabet  1. Sit with your left / right leg supported at the lower leg. ? Do not rest your foot on anything. ? Make sure your foot has room to move freely. 2. Think of your left / right foot as a paintbrush. ? Move your foot to trace each letter of the alphabet in the air. Keep your hip and knee still while you trace. ? Make the letters as large as you can without feeling discomfort. 3. Trace every letter from A to Z. Repeat __________ times. Complete this exercise __________ times a day. Strengthening exercises Eccentric plantar flexion Eccentric exercises cause a lengthening of a muscle against an external resistance. 1. Stand on the balls of your feet on the edge of a step. The ball of your foot is on the walking surface, right under your toes. ? Do not put your heels on the step. ? Rest your hand on a railing for balance. 2. If told by your health care provider, put on a backpack to add weight. 3. Rise up onto your toes (plantar flexion). 4. Keep your heels up while you slowly shift your body weight to your left / right foot. 5. Pick up your other foot. 6. Slowly lower your weight through your left / right foot so your heel drops below the level of the step (eccentric). If this causes any pain at the front of your ankle, stop the motion prior to feeling any pain. 7. Put your other foot back on the step. Repeat __________ times. Complete this exercise __________ times a day. Ankle eversion 1. Sit on the floor with your legs  straight out in front of you. 2. Loop a rubber exercise band around the ball of your left / right foot. The ball of your foot is on the walking surface, right under your toes. ? Hold the ends of the band in your hands, or secure the band to a stable object. ? The band should be slightly tense when your foot is relaxed. 3. Slowly push your foot outward, away from your other leg (eversion). 4. Hold this position for __________ seconds. 5. Slowly return your foot to the starting position. Repeat __________ times. Complete this exercise __________ times a day. Balance exercises Single leg stand If this exercise is too easy, you can try it with your eyes closed or while standing on a pillow. 1. Without shoes, stand near a railing or in a doorway. You may hold on to the railing or door frame if you need to. Let go of the railing or door frame if you are able to. 2. Stand on your left / right foot. Keep your big toe down on the floor and try to keep your arch lifted. 3. Hold this position for ____ seconds. Repeat __________ times. Complete this exercise __________ times a day. Inversion and eversion This exercise is also called foot rotation with a balance board. It uses a balance board to rotate the foot and ankle inward (inversion) and outward (eversion). Ask your health care provider where you can get a   balance board or how you can make one. 1. Stand on a non-carpeted surface near a countertop or wall. 2. Step onto the balance board so your feet are hip width apart. 3. Keep your feet in place, and keep your upper body and hips steady. 4. Using only your feet and ankles to move the board, do the following exercises as told by your health care provider: ? Tip the board side to side as far as you can, alternating between tipping to the left and tipping to the right. ? Tip the board so it silently taps the floor. Do not let the board forcefully hit the floor. ? From time to time, pause to hold a  steady midway position, with neither the right nor the left sides touching the ground. ? Tip the board side to side so the board does not hit the floor at all. From time to time, pause to hold a steady midway position. Repeat __________ times. Complete this exercise __________ times a day. Plantar flexion and dorsiflexion This exercise is also called foot flexion with a balance board. It uses a balance board to push the foot downward and away from the leg (plantar flexion) or upward and toward the leg (dorsiflexion). Ask your health care provider where you can get a balance board or how you can make one. 1. Stand on a non-carpeted surface near a countertop or wall. 2. Step onto the balance board so your feet are hip width apart. 3. Keep your feet in place, and keep your upper body and hips steady. 4. Using only your feet and ankles to move the board, do the following exercises as told by your health care provider: ? Tip the board forward and backward so the board silently taps the floor. Do not let the board forcefully hit the floor. ? From time to time, pause to hold a steady position midway between touching the floor in front and touching the floor in back. ? Tip the board forward and backward so the board does not hit the floor at all. From time to time, pause to hold a steady position. Repeat __________ times. Complete this exercise __________ times a day. This information is not intended to replace advice given to you by your health care provider. Make sure you discuss any questions you have with your health care provider. Document Revised: 08/19/2018 Document Reviewed: 02/12/2018 Elsevier Patient Education  2020 [employer]  

## 2019-09-19 LAB — CBC WITH DIFFERENTIAL/PLATELET
Absolute Monocytes: 390 cells/uL (ref 200–950)
Basophils Absolute: 19 cells/uL (ref 0–200)
Basophils Relative: 0.3 %
Eosinophils Absolute: 58 cells/uL (ref 15–500)
Eosinophils Relative: 0.9 %
HCT: 41.9 % (ref 35.0–45.0)
Hemoglobin: 13.9 g/dL (ref 11.7–15.5)
Lymphs Abs: 1587 cells/uL (ref 850–3900)
MCH: 31.4 pg (ref 27.0–33.0)
MCHC: 33.2 g/dL (ref 32.0–36.0)
MCV: 94.8 fL (ref 80.0–100.0)
MPV: 10.1 fL (ref 7.5–12.5)
Monocytes Relative: 6.1 %
Neutro Abs: 4346 cells/uL (ref 1500–7800)
Neutrophils Relative %: 67.9 %
Platelets: 334 10*3/uL (ref 140–400)
RBC: 4.42 10*6/uL (ref 3.80–5.10)
RDW: 13 % (ref 11.0–15.0)
Total Lymphocyte: 24.8 %
WBC: 6.4 10*3/uL (ref 3.8–10.8)

## 2019-09-19 LAB — COMPLETE METABOLIC PANEL WITH GFR
AG Ratio: 1.6 (calc) (ref 1.0–2.5)
ALT: 19 U/L (ref 6–29)
AST: 19 U/L (ref 10–35)
Albumin: 4.1 g/dL (ref 3.6–5.1)
Alkaline phosphatase (APISO): 107 U/L (ref 37–153)
BUN: 12 mg/dL (ref 7–25)
CO2: 29 mmol/L (ref 20–32)
Calcium: 9.5 mg/dL (ref 8.6–10.4)
Chloride: 104 mmol/L (ref 98–110)
Creat: 0.68 mg/dL (ref 0.50–1.05)
GFR, Est African American: 112 mL/min/{1.73_m2} (ref >=60)
GFR, Est Non African American: 96 mL/min/{1.73_m2} (ref >=60)
Globulin: 2.5 g/dL (calc) (ref 1.9–3.7)
Glucose, Bld: 84 mg/dL (ref 65–99)
Potassium: 4.8 mmol/L (ref 3.5–5.3)
Sodium: 140 mmol/L (ref 135–146)
Total Bilirubin: 0.3 mg/dL (ref 0.2–1.2)
Total Protein: 6.6 g/dL (ref 6.1–8.1)

## 2019-09-19 LAB — TSH+FREE T4: TSH W/REFLEX TO FT4: 0.47 mIU/L (ref 0.40–4.50)

## 2019-09-21 NOTE — Progress Notes (Signed)
All labs are normal. 

## 2019-09-22 DIAGNOSIS — G894 Chronic pain syndrome: Secondary | ICD-10-CM | POA: Diagnosis not present

## 2019-09-22 DIAGNOSIS — M170 Bilateral primary osteoarthritis of knee: Secondary | ICD-10-CM | POA: Diagnosis not present

## 2019-09-22 DIAGNOSIS — G4700 Insomnia, unspecified: Secondary | ICD-10-CM | POA: Diagnosis not present

## 2019-09-22 DIAGNOSIS — M47816 Spondylosis without myelopathy or radiculopathy, lumbar region: Secondary | ICD-10-CM | POA: Diagnosis not present

## 2019-10-14 ENCOUNTER — Other Ambulatory Visit: Payer: Self-pay | Admitting: Family Medicine

## 2019-10-22 DIAGNOSIS — M170 Bilateral primary osteoarthritis of knee: Secondary | ICD-10-CM | POA: Diagnosis not present

## 2019-10-22 DIAGNOSIS — G894 Chronic pain syndrome: Secondary | ICD-10-CM | POA: Diagnosis not present

## 2019-10-22 DIAGNOSIS — M47816 Spondylosis without myelopathy or radiculopathy, lumbar region: Secondary | ICD-10-CM | POA: Diagnosis not present

## 2019-10-22 DIAGNOSIS — G4700 Insomnia, unspecified: Secondary | ICD-10-CM | POA: Diagnosis not present

## 2019-11-23 DIAGNOSIS — G894 Chronic pain syndrome: Secondary | ICD-10-CM | POA: Diagnosis not present

## 2019-11-23 DIAGNOSIS — M170 Bilateral primary osteoarthritis of knee: Secondary | ICD-10-CM | POA: Diagnosis not present

## 2019-11-23 DIAGNOSIS — M47816 Spondylosis without myelopathy or radiculopathy, lumbar region: Secondary | ICD-10-CM | POA: Diagnosis not present

## 2019-11-23 DIAGNOSIS — G4700 Insomnia, unspecified: Secondary | ICD-10-CM | POA: Diagnosis not present

## 2019-12-14 DIAGNOSIS — G894 Chronic pain syndrome: Secondary | ICD-10-CM | POA: Diagnosis not present

## 2019-12-14 DIAGNOSIS — M170 Bilateral primary osteoarthritis of knee: Secondary | ICD-10-CM | POA: Diagnosis not present

## 2019-12-14 DIAGNOSIS — M47816 Spondylosis without myelopathy or radiculopathy, lumbar region: Secondary | ICD-10-CM | POA: Diagnosis not present

## 2019-12-14 DIAGNOSIS — G4700 Insomnia, unspecified: Secondary | ICD-10-CM | POA: Diagnosis not present

## 2020-01-17 ENCOUNTER — Other Ambulatory Visit: Payer: Self-pay | Admitting: Family Medicine

## 2020-01-25 ENCOUNTER — Other Ambulatory Visit: Payer: Self-pay | Admitting: Physical Medicine and Rehabilitation

## 2020-01-25 ENCOUNTER — Other Ambulatory Visit: Payer: Self-pay | Admitting: *Deleted

## 2020-01-25 ENCOUNTER — Other Ambulatory Visit: Payer: Self-pay

## 2020-01-25 ENCOUNTER — Ambulatory Visit (INDEPENDENT_AMBULATORY_CARE_PROVIDER_SITE_OTHER): Payer: Medicare Other

## 2020-01-25 DIAGNOSIS — M25532 Pain in left wrist: Secondary | ICD-10-CM

## 2020-01-25 DIAGNOSIS — M170 Bilateral primary osteoarthritis of knee: Secondary | ICD-10-CM | POA: Diagnosis not present

## 2020-01-25 DIAGNOSIS — M19042 Primary osteoarthritis, left hand: Secondary | ICD-10-CM | POA: Diagnosis not present

## 2020-01-25 DIAGNOSIS — G894 Chronic pain syndrome: Secondary | ICD-10-CM | POA: Diagnosis not present

## 2020-01-25 DIAGNOSIS — M19032 Primary osteoarthritis, left wrist: Secondary | ICD-10-CM | POA: Diagnosis not present

## 2020-01-25 DIAGNOSIS — G4700 Insomnia, unspecified: Secondary | ICD-10-CM | POA: Diagnosis not present

## 2020-01-25 DIAGNOSIS — M47816 Spondylosis without myelopathy or radiculopathy, lumbar region: Secondary | ICD-10-CM | POA: Diagnosis not present

## 2020-02-22 DIAGNOSIS — G4700 Insomnia, unspecified: Secondary | ICD-10-CM | POA: Diagnosis not present

## 2020-02-22 DIAGNOSIS — G894 Chronic pain syndrome: Secondary | ICD-10-CM | POA: Diagnosis not present

## 2020-02-22 DIAGNOSIS — M170 Bilateral primary osteoarthritis of knee: Secondary | ICD-10-CM | POA: Diagnosis not present

## 2020-02-22 DIAGNOSIS — M47816 Spondylosis without myelopathy or radiculopathy, lumbar region: Secondary | ICD-10-CM | POA: Diagnosis not present

## 2020-03-14 DIAGNOSIS — G4700 Insomnia, unspecified: Secondary | ICD-10-CM | POA: Diagnosis not present

## 2020-03-14 DIAGNOSIS — G894 Chronic pain syndrome: Secondary | ICD-10-CM | POA: Diagnosis not present

## 2020-03-14 DIAGNOSIS — M47816 Spondylosis without myelopathy or radiculopathy, lumbar region: Secondary | ICD-10-CM | POA: Diagnosis not present

## 2020-03-14 DIAGNOSIS — M170 Bilateral primary osteoarthritis of knee: Secondary | ICD-10-CM | POA: Diagnosis not present

## 2020-04-18 DIAGNOSIS — M170 Bilateral primary osteoarthritis of knee: Secondary | ICD-10-CM | POA: Diagnosis not present

## 2020-04-18 DIAGNOSIS — G4700 Insomnia, unspecified: Secondary | ICD-10-CM | POA: Diagnosis not present

## 2020-04-18 DIAGNOSIS — M47816 Spondylosis without myelopathy or radiculopathy, lumbar region: Secondary | ICD-10-CM | POA: Diagnosis not present

## 2020-04-18 DIAGNOSIS — G894 Chronic pain syndrome: Secondary | ICD-10-CM | POA: Diagnosis not present

## 2020-05-18 DIAGNOSIS — G894 Chronic pain syndrome: Secondary | ICD-10-CM | POA: Diagnosis not present

## 2020-05-18 DIAGNOSIS — G4700 Insomnia, unspecified: Secondary | ICD-10-CM | POA: Diagnosis not present

## 2020-05-18 DIAGNOSIS — M170 Bilateral primary osteoarthritis of knee: Secondary | ICD-10-CM | POA: Diagnosis not present

## 2020-05-18 DIAGNOSIS — M47816 Spondylosis without myelopathy or radiculopathy, lumbar region: Secondary | ICD-10-CM | POA: Diagnosis not present

## 2020-06-20 DIAGNOSIS — M47816 Spondylosis without myelopathy or radiculopathy, lumbar region: Secondary | ICD-10-CM | POA: Diagnosis not present

## 2020-06-20 DIAGNOSIS — G4700 Insomnia, unspecified: Secondary | ICD-10-CM | POA: Diagnosis not present

## 2020-06-20 DIAGNOSIS — G894 Chronic pain syndrome: Secondary | ICD-10-CM | POA: Diagnosis not present

## 2020-06-20 DIAGNOSIS — M170 Bilateral primary osteoarthritis of knee: Secondary | ICD-10-CM | POA: Diagnosis not present

## 2020-07-18 DIAGNOSIS — G894 Chronic pain syndrome: Secondary | ICD-10-CM | POA: Diagnosis not present

## 2020-07-18 DIAGNOSIS — M47816 Spondylosis without myelopathy or radiculopathy, lumbar region: Secondary | ICD-10-CM | POA: Diagnosis not present

## 2020-07-18 DIAGNOSIS — G4700 Insomnia, unspecified: Secondary | ICD-10-CM | POA: Diagnosis not present

## 2020-07-18 DIAGNOSIS — M170 Bilateral primary osteoarthritis of knee: Secondary | ICD-10-CM | POA: Diagnosis not present

## 2020-08-22 DIAGNOSIS — M47816 Spondylosis without myelopathy or radiculopathy, lumbar region: Secondary | ICD-10-CM | POA: Diagnosis not present

## 2020-08-22 DIAGNOSIS — M170 Bilateral primary osteoarthritis of knee: Secondary | ICD-10-CM | POA: Diagnosis not present

## 2020-08-22 DIAGNOSIS — G894 Chronic pain syndrome: Secondary | ICD-10-CM | POA: Diagnosis not present

## 2020-08-22 DIAGNOSIS — G4700 Insomnia, unspecified: Secondary | ICD-10-CM | POA: Diagnosis not present

## 2020-09-13 DIAGNOSIS — M170 Bilateral primary osteoarthritis of knee: Secondary | ICD-10-CM | POA: Diagnosis not present

## 2020-09-13 DIAGNOSIS — G4700 Insomnia, unspecified: Secondary | ICD-10-CM | POA: Diagnosis not present

## 2020-09-13 DIAGNOSIS — M47816 Spondylosis without myelopathy or radiculopathy, lumbar region: Secondary | ICD-10-CM | POA: Diagnosis not present

## 2020-09-13 DIAGNOSIS — G894 Chronic pain syndrome: Secondary | ICD-10-CM | POA: Diagnosis not present

## 2020-10-17 DIAGNOSIS — M47816 Spondylosis without myelopathy or radiculopathy, lumbar region: Secondary | ICD-10-CM | POA: Diagnosis not present

## 2020-10-17 DIAGNOSIS — G894 Chronic pain syndrome: Secondary | ICD-10-CM | POA: Diagnosis not present

## 2020-10-17 DIAGNOSIS — M170 Bilateral primary osteoarthritis of knee: Secondary | ICD-10-CM | POA: Diagnosis not present

## 2020-10-17 DIAGNOSIS — G4700 Insomnia, unspecified: Secondary | ICD-10-CM | POA: Diagnosis not present

## 2020-11-16 ENCOUNTER — Other Ambulatory Visit: Payer: Self-pay | Admitting: Physical Medicine and Rehabilitation

## 2020-11-16 ENCOUNTER — Ambulatory Visit (INDEPENDENT_AMBULATORY_CARE_PROVIDER_SITE_OTHER): Payer: Medicare Other

## 2020-11-16 ENCOUNTER — Other Ambulatory Visit: Payer: Self-pay

## 2020-11-16 DIAGNOSIS — M47816 Spondylosis without myelopathy or radiculopathy, lumbar region: Secondary | ICD-10-CM | POA: Diagnosis not present

## 2020-11-16 DIAGNOSIS — G894 Chronic pain syndrome: Secondary | ICD-10-CM | POA: Diagnosis not present

## 2020-11-16 DIAGNOSIS — M79671 Pain in right foot: Secondary | ICD-10-CM | POA: Diagnosis not present

## 2020-11-16 DIAGNOSIS — M7731 Calcaneal spur, right foot: Secondary | ICD-10-CM | POA: Diagnosis not present

## 2020-11-16 DIAGNOSIS — M170 Bilateral primary osteoarthritis of knee: Secondary | ICD-10-CM | POA: Diagnosis not present

## 2020-11-16 DIAGNOSIS — M7989 Other specified soft tissue disorders: Secondary | ICD-10-CM | POA: Diagnosis not present

## 2020-11-16 DIAGNOSIS — M19071 Primary osteoarthritis, right ankle and foot: Secondary | ICD-10-CM | POA: Diagnosis not present

## 2020-11-16 DIAGNOSIS — G4700 Insomnia, unspecified: Secondary | ICD-10-CM | POA: Diagnosis not present

## 2020-11-16 DIAGNOSIS — M2011 Hallux valgus (acquired), right foot: Secondary | ICD-10-CM | POA: Diagnosis not present

## 2020-11-22 ENCOUNTER — Other Ambulatory Visit: Payer: Self-pay

## 2020-11-22 ENCOUNTER — Emergency Department (INDEPENDENT_AMBULATORY_CARE_PROVIDER_SITE_OTHER)
Admission: EM | Admit: 2020-11-22 | Discharge: 2020-11-22 | Disposition: A | Discharge status: Home / Self Care | Payer: Medicare Other | Source: Home / Self Care

## 2020-11-22 DIAGNOSIS — M5442 Lumbago with sciatica, left side: Secondary | ICD-10-CM

## 2020-11-22 DIAGNOSIS — M5441 Lumbago with sciatica, right side: Secondary | ICD-10-CM

## 2020-11-22 HISTORY — DX: Dorsalgia, unspecified: M54.9

## 2020-11-22 MED ORDER — PREDNISONE 10 MG (21) PO TBPK: ORAL_TABLET | ORAL | 0 refills | Status: DC

## 2020-11-22 NOTE — ED Triage Notes (Signed)
Pt presents to Urgent Care with c/o lower back pain which radiates to both legs since yesterday. Pt reports lifting things in her basement and going up/down stairs frequently yesterday. She states she has a hx of back problems and "narrowing of the spine."

## 2020-11-22 NOTE — ED Provider Notes (Signed)
[name]    CSN: [id number] Arrival date & time: 11/22/20  1846      History   Chief Complaint Chief Complaint  Patient presents with   Back Pain    HPI Carolyn Bauer is a 60 y.o. female.   Patient presents today with a 1 day history of lower back pain.  Reports that prior to symptom onset she had been working in the basement lifting boxes which she believes strained her lower back.  She has a history of chronic back pain with known degenerative disc disease and spinal stenosis.  She has not had previous surgeries but has been told that she will need surgery in the near future.  She is managed with methadone and hydrocodone but states current medication regimen is not providing any relief of pain.  Pain is rated 8 on a 0-10 pain scale, localized to lower back with radiation into both legs but worse on the right, described as aching and periodic shooting pains, no aggravating relieving factors identified.  Patient reports history of similar flares in the past that resolved with prednisone taper and she is requesting this if appropriate today.  Denies any history of diabetes.  She denies any bowel/bladder incontinence, lower extremity weakness, saddle anesthesia.  She is unable to perform daily activities as result of symptoms.   Past Medical History:  Diagnosis Date   Back pain    Fibromyalgia    Thyroid disease     Patient Active Problem List   Diagnosis Date Noted   Cervical spondylosis 02/14/2018   IRREGULAR MENSTRUATION 12/23/2006   HIP PAIN, RIGHT 12/23/2006   GOITER NOS 02/19/2006   TOBACCO DEPENDENCE 02/19/2006   MENOPAUSAL SYNDROME 02/19/2006   MYALGIA/MYOSITIS, UNSPECIFIED 02/19/2006    Past Surgical History:  Procedure Laterality Date   CESAREAN SECTION      OB History   No obstetric history on file.      Home Medications    Prior to Admission medications   Medication Sig Start Date End Date Taking? Authorizing Provider   HYDROcodone-acetaminophen (NORCO) 10-325 MG tablet Take 1 tablet by mouth every 6 (six) hours as needed.   Yes [provider]  methadone (DOLOPHINE) 10 MG tablet Take 10 mg by mouth every 8 (eight) hours.    [provider]  predniSONE (STERAPRED UNI-PAK 21 TAB) 10 MG (21) TBPK tablet Take 6 tablets (60 mg) days 1 and 2, take 5 tablets (50 mg) days 3 and 4, take 4 tablets (40 mg) days 5 and 6, take 3 tablets (30 mg) days 7 and 8, take 2 tablets (20 mg) days 9 and 10, take 1 tablet (10 mg) days 11 and 12 and then stop. 11/22/20   Imagine Nest, Noberto Retort, PA-C  zolpidem (AMBIEN) 10 MG tablet Take 10 mg by mouth at bedtime as needed. 08/30/19   [provider]    Family History Family History  Problem Relation Age of Onset   Heart attack Mother    Kidney failure Mother    Healthy Father     Social History Social History   Tobacco Use   Smoking status: Every Day    Packs/day: 1.00    Pack years: 0.00    Types: Cigarettes   Smokeless tobacco: Never  Vaping Use   Vaping Use: Never used  Substance Use Topics   Alcohol use: No   Drug use: Not Currently     Allergies   Morphine and related and Acetaminophen-codeine   Review  of Systems Review of Systems  Constitutional:  Positive for activity change. Negative for appetite change, fatigue and fever.  Respiratory:  Negative for cough and shortness of breath.   Cardiovascular:  Negative for chest pain.  Gastrointestinal:  Negative for abdominal pain, diarrhea, nausea and vomiting.  Musculoskeletal:  Positive for back pain. Negative for arthralgias and myalgias.  Neurological:  Negative for dizziness, weakness, light-headedness, numbness and headaches.    Physical Exam Triage Vital Signs ED Triage Vitals  Enc Vitals Group     BP 11/22/20 1904 (!) 149/87     Pulse Rate 11/22/20 1904 (!) 59     Resp 11/22/20 1904 20     Temp 11/22/20 1904 98.5 F (36.9 C)     Temp Source 11/22/20 1904 Oral     SpO2 11/22/20  1904 96 %     Weight 11/22/20 1859 130 lb (59 kg)     Height 11/22/20 1859 5\' 2"  (1.575 m)     Head Circumference --      Peak Flow --      Pain Score 11/22/20 1858 7     Pain Loc --      Pain Edu? --      Excl. in GC? --    No data found.  Updated Vital Signs BP (!) 149/87   Pulse (!) 59   Temp 98.5 F (36.9 C) (Oral)   Resp 20   Ht 5\' 2"  (1.575 m)   Wt 130 lb (59 kg)   SpO2 96%   BMI 23.78 kg/m   Visual Acuity Right Eye Distance:   Left Eye Distance:   Bilateral Distance:    Right Eye Near:   Left Eye Near:    Bilateral Near:     Physical Exam Vitals reviewed.  Constitutional:      General: She is awake. She is not in acute distress.    Appearance: Normal appearance. She is normal weight. She is not ill-appearing.     Comments: Very pleasant female appears stated age in no acute distress sitting  HENT:     Head: Normocephalic and atraumatic.  Cardiovascular:     Rate and Rhythm: Normal rate and regular rhythm.     Heart sounds: Normal heart sounds, S1 normal and S2 normal. No murmur heard. Pulmonary:     Effort: Pulmonary effort is normal.     Breath sounds: Normal breath sounds. No wheezing, rhonchi or rales.     Comments: Clear to auscultation bilaterally Abdominal:     General: Bowel sounds are normal.     Palpations: Abdomen is soft.     Tenderness: There is no abdominal tenderness. There is no right CVA tenderness, left CVA tenderness, guarding or rebound.  Musculoskeletal:     Cervical back: Spasms and tenderness present. No bony tenderness.     Thoracic back: Tenderness present. No bony tenderness.     Lumbar back: Tenderness present. No bony tenderness.     Comments: No pain percussion of vertebrae.  Tenderness palpation throughout bilateral paraspinal muscles.  Patient unable to get onto exam table for complete examination due to pain.  Psychiatric:        Behavior: Behavior is cooperative.     UC Treatments / Results  Labs (all labs ordered  are listed, but only abnormal results are displayed) Labs Reviewed - No data to display  EKG   Radiology No results found.  Procedures Procedures (including critical care time)  Medications Ordered in UC Medications -  No data to display  Initial Impression / Assessment and Plan / UC Course  I have reviewed the triage vital signs and the nursing notes.  Pertinent labs & imaging results that were available during my care of the patient were reviewed by me and considered in my medical decision making (see chart for details).      No x-rays obtained given no traumatic injury and lack of bony tenderness.  Patient is to continue pain medication as previously prescribed.  She was prescribed prednisone taper with instruction to take NSAIDs with this medication due to risk of GI bleeding.  Encouraged her to use conservative treatment measures including heat and rest and stretch for symptom relief.  Encouraged her to follow-up with primary care provider for further evaluation as she may require repeat MRI and/or physical therapy referral if symptoms do not improve.  Discussed alarm symptoms that warrant emergent evaluation.  Strict return precautions given to which patient expressed understanding.  Final Clinical Impressions(s) / UC Diagnoses   Final diagnoses:  Acute bilateral low back pain with bilateral sciatica     Discharge Instructions      Take prednisone as prescribed.  Do not take NSAIDs (aspirin, ibuprofen/Advil, naproxen/Aleve) with this medication as it can cause stomach bleeding.  You can continue your pain medication as previously prescribed.  Use heat, rest, stretch for additional symptom relief.  If you have any worsening symptoms including going to the bathroom yourself without noticing it, numbness of your inner thighs, weakness you need to be seen immediately.     ED Prescriptions     Medication Sig Dispense Auth. Provider   predniSONE (STERAPRED UNI-PAK 21 TAB) 10  MG (21) TBPK tablet  (Status: Discontinued) Take 6 tablets (60 mg) days 1 and 2, take 5 tablets (50 mg) days 3 and 4, take 4 tablets (40 mg) days 5 and 6, take 3 tablets (30 mg) days 7 and 8, take 2 tablets (20 mg) days 9 and 10, take 1 tablet (10 mg) days 11 and 12 and then stop. 42 tablet Karron Goens K, PA-C   predniSONE (STERAPRED UNI-PAK 21 TAB) 10 MG (21) TBPK tablet Take 6 tablets (60 mg) days 1 and 2, take 5 tablets (50 mg) days 3 and 4, take 4 tablets (40 mg) days 5 and 6, take 3 tablets (30 mg) days 7 and 8, take 2 tablets (20 mg) days 9 and 10, take 1 tablet (10 mg) days 11 and 12 and then stop. 42 tablet Naisha Wisdom, Noberto Retort, PA-C      PDMP not reviewed this encounter.   Jeani Hawking, PA-C 11/22/20 1920

## 2020-11-22 NOTE — Discharge Instructions (Addendum)
Take prednisone as prescribed.  Do not take NSAIDs (aspirin, ibuprofen/Advil, naproxen/Aleve) with this medication as it can cause stomach bleeding.  You can continue your pain medication as previously prescribed.  Use heat, rest, stretch for additional symptom relief.  If you have any worsening symptoms including going to the bathroom yourself without noticing it, numbness of your inner thighs, weakness you need to be seen immediately.

## 2020-12-07 ENCOUNTER — Emergency Department (INDEPENDENT_AMBULATORY_CARE_PROVIDER_SITE_OTHER)
Admission: EM | Admit: 2020-12-07 | Discharge: 2020-12-07 | Disposition: A | Discharge status: Home / Self Care | Payer: Medicare Other | Source: Home / Self Care | Attending: Family Medicine | Admitting: Family Medicine

## 2020-12-07 ENCOUNTER — Emergency Department (INDEPENDENT_AMBULATORY_CARE_PROVIDER_SITE_OTHER): Payer: Medicare Other

## 2020-12-07 ENCOUNTER — Other Ambulatory Visit: Payer: Self-pay

## 2020-12-07 DIAGNOSIS — R071 Chest pain on breathing: Secondary | ICD-10-CM | POA: Diagnosis not present

## 2020-12-07 DIAGNOSIS — M545 Low back pain, unspecified: Secondary | ICD-10-CM | POA: Diagnosis not present

## 2020-12-07 DIAGNOSIS — R0781 Pleurodynia: Secondary | ICD-10-CM

## 2020-12-07 LAB — POCT URINALYSIS DIP (MANUAL ENTRY)
Bilirubin, UA: NEGATIVE
Blood, UA: NEGATIVE
Glucose, UA: NEGATIVE mg/dL
Ketones, POC UA: NEGATIVE mg/dL
Leukocytes, UA: NEGATIVE
Nitrite, UA: NEGATIVE
Protein Ur, POC: NEGATIVE mg/dL
Spec Grav, UA: 1.025 (ref 1.010–1.025)
Urobilinogen, UA: 0.2 E.U./dL
pH, UA: 5.5 (ref 5.0–8.0)

## 2020-12-07 MED ORDER — KETOROLAC TROMETHAMINE 60 MG/2ML IM SOLN
60.0000 mg | Freq: Once | INTRAMUSCULAR | Status: AC
  Administered 2020-12-07: 60 mg via INTRAMUSCULAR

## 2020-12-07 NOTE — Discharge Instructions (Addendum)
Continue your current pain medicine Try ice or heat to painful area Call your pain doctor if the pain persists You will be called if any abnormalities are found on your chest x-ray

## 2020-12-07 NOTE — ED Provider Notes (Signed)
[name]    CSN: [id number] Arrival date & time: 12/07/20  1842      History   Chief Complaint Chief Complaint  Patient presents with   Back Pain    HPI Carolyn Bauer is a 60 y.o. female.   HPI  Patient is a chronic pain patient.  Under the care of the pain clinic in [place].  Takes methadone daily as well as as needed hydrocodone 10 mg.  Her narcotic use was reviewed on the database. She endorses fibromyalgia plus osteoarthritis plus disc disease in her neck and back as the sources of her pain.  Her pain is reasonably controlled on her usual medications. She woke up this morning with back pain.  She points to the upper lumbar region.  She states it hurts with every breath that she takes.  It hurts if she laughs, sneezes, coughs.  She is a cigarette smoker.  She has not had any recent lung infection, sputum, cough or fever.  She has not had any fall or injury to her her back, no heavy lifting or activity.  She states this is different than her usual pain which is in the L5 region and sacrum. No flank pain, dysuria, fever, or nausea  Past Medical History:  Diagnosis Date   Back pain    Fibromyalgia    Thyroid disease     Patient Active Problem List   Diagnosis Date Noted   Cervical spondylosis 02/14/2018   IRREGULAR MENSTRUATION 12/23/2006   HIP PAIN, RIGHT 12/23/2006   GOITER NOS 02/19/2006   TOBACCO DEPENDENCE 02/19/2006   MENOPAUSAL SYNDROME 02/19/2006   MYALGIA/MYOSITIS, UNSPECIFIED 02/19/2006    Past Surgical History:  Procedure Laterality Date   CESAREAN SECTION      OB History   No obstetric history on file.      Home Medications    Prior to Admission medications   Medication Sig Start Date End Date Taking? Authorizing Provider  HYDROcodone-acetaminophen (NORCO) 10-325 MG tablet Take 1 tablet by mouth every 6 (six) hours as needed.    [provider]  methadone (DOLOPHINE) 10 MG tablet Take 10 mg by mouth every 8 (eight)  hours.    [provider]  predniSONE (STERAPRED UNI-PAK 21 TAB) 10 MG (21) TBPK tablet Take 6 tablets (60 mg) days 1 and 2, take 5 tablets (50 mg) days 3 and 4, take 4 tablets (40 mg) days 5 and 6, take 3 tablets (30 mg) days 7 and 8, take 2 tablets (20 mg) days 9 and 10, take 1 tablet (10 mg) days 11 and 12 and then stop. 11/22/20   Raspet, Noberto Retort, PA-C  zolpidem (AMBIEN) 10 MG tablet Take 10 mg by mouth at bedtime as needed. 08/30/19   [provider]    Family History Family History  Problem Relation Age of Onset   Heart attack Mother    Kidney failure Mother    Healthy Father     Social History Social History   Tobacco Use   Smoking status: Every Day    Packs/day: 1.00    Types: Cigarettes   Smokeless tobacco: Never  Vaping Use   Vaping Use: Never used  Substance Use Topics   Alcohol use: No   Drug use: Not Currently     Allergies   Morphine and related and Acetaminophen-codeine   Review of Systems Review of Systems See HPI  Physical Exam Triage Vital Signs ED Triage Vitals  Enc Vitals Group  BP 12/07/20 1857 129/82     Pulse Rate 12/07/20 1857 72     Resp 12/07/20 1857 20     Temp 12/07/20 1857 99.5 F (37.5 C)     Temp Source 12/07/20 1857 Oral     SpO2 12/07/20 1857 97 %     Weight 12/07/20 1855 130 lb (59 kg)     Height 12/07/20 1855 5\' 2"  (1.575 m)     Head Circumference --      Peak Flow --      Pain Score 12/07/20 1854 8     Pain Loc --      Pain Edu? --      Excl. in GC? --    No data found.  Updated Vital Signs BP 129/82 (BP Location: Right Arm)   Pulse 72   Temp 99.5 F (37.5 C) (Oral)   Resp 20   Ht 5\' 2"  (1.575 m)   Wt 59 kg   SpO2 97%   BMI 23.78 kg/m      Physical Exam Constitutional:      General: She is in acute distress.     Appearance: She is well-developed.     Comments: Appears tired.  Guarded movements.  Looks uncomfortable  HENT:     Head: Normocephalic and atraumatic.     Mouth/Throat:      Comments: Mask is in place Eyes:     Conjunctiva/sclera: Conjunctivae normal.     Pupils: Pupils are equal, round, and reactive to light.  Cardiovascular:     Rate and Rhythm: Normal rate.  Pulmonary:     Effort: Pulmonary effort is normal. No respiratory distress.     Breath sounds: Normal breath sounds. No wheezing or rales.  Abdominal:     General: There is no distension.     Palpations: Abdomen is soft.  Musculoskeletal:        General: No tenderness. Normal range of motion.     Cervical back: Normal range of motion.     Comments: Pain is in the upper lumbar region, from T12 to go 3.  There is no tenderness to palpation of the thoracic or lumbar spines.  The lumbar muscles feel slightly tight.  Her movement is very stiff and guarded.  No radiation into arms or legs, reports of numbness or weakness.  Skin:    General: Skin is warm and dry.  Neurological:     Mental Status: She is alert.     UC Treatments / Results  Labs (all labs ordered are listed, but only abnormal results are displayed) Labs Reviewed  POCT URINALYSIS DIP (MANUAL ENTRY)  Urinalysis is negative  EKG   Radiology DG Chest 2 View  Result Date: 12/07/2020 CLINICAL DATA:  Pain with breathing. Patient reports pain with breathing and in bilateral lower ribs since this morning. No known injury. EXAM: CHEST - 2 VIEW COMPARISON:  None. FINDINGS: The cardiomediastinal contours are normal. Mild bronchial thickening. Pulmonary vasculature is normal. No consolidation, pleural effusion, or pneumothorax. No evidence of pulmonary mass or nodule. No acute osseous abnormalities are seen. IMPRESSION: Mild bronchial thickening may be related to smoking, bronchitis or asthma. No localizing pulmonary process. Electronically Signed   By: M.D.   On: 12/07/2020 20:01    Procedures Procedures (including critical care time)  Medications Ordered in UC Medications  ketorolac (TORADOL) injection 60 mg (60 mg  Intramuscular Given 12/07/20 1931)    Initial Impression / Assessment and Plan / UC Course  I have reviewed the triage vital signs and the nursing notes.  Pertinent labs & imaging results that were available during my care of the patient were reviewed by me and considered in my medical decision making (see chart for details).     As soon as x-ray was completed the patient requested to leave.  States she felt better.  She had not received any real treatment except a shot of Toradol perhaps 15 or 20 minutes ago.  It is too soon for her to have helped her.  She is discharge pending x-ray report.  She declines my offer of muscle relaxers.   After discharge she did ask for blood work.  She has renal look at her right foot which is already been evaluated by her provider.  She had some chronic complaints that I let her know were not appropriate for adding onto an urgent care visit.  I encouraged her to see her usual primary doctor and pain specialty doctors Final Clinical Impressions(s) / UC Diagnoses   Final diagnoses:  Lumbar pain     Discharge Instructions      Continue your current pain medicine Try ice or heat to painful area Call your pain doctor if the pain persists You will be called if any abnormalities are found on your chest x-ray     ED Prescriptions   None    I have reviewed the PDMP during this encounter.   Eustace Moore, MD 12/07/20 2009

## 2020-12-07 NOTE — ED Triage Notes (Addendum)
Pt presents to Urgent Care with c/o back pain since this AM. Denies injury. Reports pain in mid-back bilaterally, stating it hurts worse when she breathes.

## 2020-12-08 DIAGNOSIS — Z885 Allergy status to narcotic agent status: Secondary | ICD-10-CM | POA: Diagnosis not present

## 2020-12-08 DIAGNOSIS — M797 Fibromyalgia: Secondary | ICD-10-CM | POA: Diagnosis not present

## 2020-12-08 DIAGNOSIS — M545 Low back pain, unspecified: Secondary | ICD-10-CM | POA: Diagnosis not present

## 2020-12-08 DIAGNOSIS — Z79899 Other long term (current) drug therapy: Secondary | ICD-10-CM | POA: Diagnosis not present

## 2020-12-08 DIAGNOSIS — M546 Pain in thoracic spine: Secondary | ICD-10-CM | POA: Diagnosis not present

## 2020-12-08 DIAGNOSIS — G8929 Other chronic pain: Secondary | ICD-10-CM | POA: Diagnosis not present

## 2020-12-08 DIAGNOSIS — F17210 Nicotine dependence, cigarettes, uncomplicated: Secondary | ICD-10-CM | POA: Diagnosis not present

## 2020-12-08 DIAGNOSIS — Z79891 Long term (current) use of opiate analgesic: Secondary | ICD-10-CM | POA: Diagnosis not present

## 2020-12-08 DIAGNOSIS — K449 Diaphragmatic hernia without obstruction or gangrene: Secondary | ICD-10-CM | POA: Diagnosis not present

## 2020-12-14 DIAGNOSIS — G4700 Insomnia, unspecified: Secondary | ICD-10-CM | POA: Diagnosis not present

## 2020-12-14 DIAGNOSIS — G894 Chronic pain syndrome: Secondary | ICD-10-CM | POA: Diagnosis not present

## 2020-12-14 DIAGNOSIS — Z79891 Long term (current) use of opiate analgesic: Secondary | ICD-10-CM | POA: Diagnosis not present

## 2020-12-14 DIAGNOSIS — M170 Bilateral primary osteoarthritis of knee: Secondary | ICD-10-CM | POA: Diagnosis not present

## 2020-12-14 DIAGNOSIS — M47816 Spondylosis without myelopathy or radiculopathy, lumbar region: Secondary | ICD-10-CM | POA: Diagnosis not present

## 2021-01-17 DIAGNOSIS — M47816 Spondylosis without myelopathy or radiculopathy, lumbar region: Secondary | ICD-10-CM | POA: Diagnosis not present

## 2021-01-17 DIAGNOSIS — G4700 Insomnia, unspecified: Secondary | ICD-10-CM | POA: Diagnosis not present

## 2021-01-17 DIAGNOSIS — M170 Bilateral primary osteoarthritis of knee: Secondary | ICD-10-CM | POA: Diagnosis not present

## 2021-01-17 DIAGNOSIS — G894 Chronic pain syndrome: Secondary | ICD-10-CM | POA: Diagnosis not present

## 2021-02-14 DIAGNOSIS — G4700 Insomnia, unspecified: Secondary | ICD-10-CM | POA: Diagnosis not present

## 2021-02-14 DIAGNOSIS — G894 Chronic pain syndrome: Secondary | ICD-10-CM | POA: Diagnosis not present

## 2021-02-14 DIAGNOSIS — M47816 Spondylosis without myelopathy or radiculopathy, lumbar region: Secondary | ICD-10-CM | POA: Diagnosis not present

## 2021-02-14 DIAGNOSIS — M170 Bilateral primary osteoarthritis of knee: Secondary | ICD-10-CM | POA: Diagnosis not present

## 2021-03-14 DIAGNOSIS — M47816 Spondylosis without myelopathy or radiculopathy, lumbar region: Secondary | ICD-10-CM | POA: Diagnosis not present

## 2021-03-14 DIAGNOSIS — G4700 Insomnia, unspecified: Secondary | ICD-10-CM | POA: Diagnosis not present

## 2021-03-14 DIAGNOSIS — G894 Chronic pain syndrome: Secondary | ICD-10-CM | POA: Diagnosis not present

## 2021-03-14 DIAGNOSIS — M170 Bilateral primary osteoarthritis of knee: Secondary | ICD-10-CM | POA: Diagnosis not present

## 2021-04-11 DIAGNOSIS — M170 Bilateral primary osteoarthritis of knee: Secondary | ICD-10-CM | POA: Diagnosis not present

## 2021-04-11 DIAGNOSIS — G4700 Insomnia, unspecified: Secondary | ICD-10-CM | POA: Diagnosis not present

## 2021-04-11 DIAGNOSIS — M47816 Spondylosis without myelopathy or radiculopathy, lumbar region: Secondary | ICD-10-CM | POA: Diagnosis not present

## 2021-04-11 DIAGNOSIS — G894 Chronic pain syndrome: Secondary | ICD-10-CM | POA: Diagnosis not present

## 2021-04-20 ENCOUNTER — Telehealth: Payer: Self-pay | Admitting: Family Medicine

## 2021-04-20 DIAGNOSIS — Z1211 Encounter for screening for malignant neoplasm of colon: Secondary | ICD-10-CM

## 2021-04-20 DIAGNOSIS — Z01419 Encounter for gynecological examination (general) (routine) without abnormal findings: Secondary | ICD-10-CM

## 2021-04-20 NOTE — Telephone Encounter (Signed)
Please call patient and see if she maybe goes to GYN somewhere and gets her Pap and mammogram done we do not have those up-to-date on her file if not we are happy to get her scheduled for her or get her in here for an appointment.  Also she is very overdue for colon cancer screening see if she would be interested in Cologuard as an option.

## 2021-04-20 NOTE — Telephone Encounter (Signed)
LM with pt's son for pt to call.  Carolyn Bauer, CMA

## 2021-04-25 NOTE — Telephone Encounter (Signed)
Pt states that she does not have a GYN and requests referral.  Also ordered Cologuard.  Tiajuana Amass, CMA

## 2021-04-25 NOTE — Addendum Note (Signed)
Addended by: Stan Head on: 04/25/2021 01:43 PM   Modules accepted: Orders

## 2021-05-11 ENCOUNTER — Emergency Department (INDEPENDENT_AMBULATORY_CARE_PROVIDER_SITE_OTHER)
Admission: EM | Admit: 2021-05-11 | Discharge: 2021-05-11 | Disposition: A | Discharge status: Home / Self Care | Payer: Medicare Other | Source: Home / Self Care

## 2021-05-11 ENCOUNTER — Encounter: Payer: Self-pay | Admitting: Emergency Medicine

## 2021-05-11 ENCOUNTER — Other Ambulatory Visit: Payer: Self-pay

## 2021-05-11 DIAGNOSIS — R0981 Nasal congestion: Secondary | ICD-10-CM | POA: Diagnosis not present

## 2021-05-11 DIAGNOSIS — R5381 Other malaise: Secondary | ICD-10-CM

## 2021-05-11 DIAGNOSIS — J0140 Acute pansinusitis, unspecified: Secondary | ICD-10-CM

## 2021-05-11 DIAGNOSIS — R5383 Other fatigue: Secondary | ICD-10-CM | POA: Diagnosis not present

## 2021-05-11 MED ORDER — AMOXICILLIN-POT CLAVULANATE 875-125 MG PO TABS: 1.0000 | ORAL_TABLET | Freq: Two times a day (BID) | ORAL | 0 refills | Status: DC

## 2021-05-11 NOTE — ED Provider Notes (Signed)
[facility]    CSN: [id number] Arrival date & time: 05/11/21  1846      History   Chief Complaint Chief Complaint  Patient presents with   URI    HPI Carolyn Bauer is a 60 y.o. female.   Patient presents today with a several week history of URI symptoms.  Reports headache, sore throat, nasal congestion, sinus pressure, intermittent body aches.  She denies any fever, severe cough, chest pain, shortness of breath, nausea, vomiting.  She has not tried any over-the-counter medication for symptom management.  She has taken several at home COVID test since symptom onset that were negative.  She has had COVID-19 vaccine but has not had flu shot.  Denies any recent antibiotic use.  She denies history of allergies, asthma, COPD.  She is a current everyday smoker.   Past Medical History:  Diagnosis Date   Back pain    Fibromyalgia    Thyroid disease     Patient Active Problem List   Diagnosis Date Noted   Cervical spondylosis 02/14/2018   IRREGULAR MENSTRUATION 12/23/2006   HIP PAIN, RIGHT 12/23/2006   GOITER NOS 02/19/2006   TOBACCO DEPENDENCE 02/19/2006   MENOPAUSAL SYNDROME 02/19/2006   MYALGIA/MYOSITIS, UNSPECIFIED 02/19/2006    Past Surgical History:  Procedure Laterality Date   CESAREAN SECTION      OB History   No obstetric history on file.      Home Medications    Prior to Admission medications   Medication Sig Start Date End Date Taking? Authorizing Provider  amoxicillin-clavulanate (AUGMENTIN) 875-125 MG tablet Take 1 tablet by mouth every 12 (twelve) hours. 05/11/21  Yes Demichael Traum, Derry Skill, PA-C  HYDROcodone-acetaminophen (NORCO) 10-325 MG tablet Take 1 tablet by mouth every 6 (six) hours as needed.   Yes [provider]  methadone (DOLOPHINE) 10 MG tablet Take 10 mg by mouth every 8 (eight) hours.   Yes [provider]  zolpidem (AMBIEN) 10 MG tablet Take 10 mg by mouth at bedtime as needed. 08/30/19  Yes [provider]     Family History Family History  Problem Relation Age of Onset   Heart attack Mother    Kidney failure Mother    Healthy Father     Social History Social History   Tobacco Use   Smoking status: Every Day    Packs/day: 1.00    Types: Cigarettes   Smokeless tobacco: Never  Vaping Use   Vaping Use: Never used  Substance Use Topics   Alcohol use: No   Drug use: Not Currently     Allergies   Morphine and related and Acetaminophen-codeine   Review of Systems Review of Systems  Constitutional:  Positive for activity change and fatigue. Negative for appetite change and fever.  HENT:  Positive for congestion, postnasal drip, sinus pressure and sore throat. Negative for sneezing.   Respiratory:  Positive for cough (Mild). Negative for shortness of breath.   Cardiovascular:  Negative for chest pain.  Gastrointestinal:  Negative for abdominal pain, diarrhea, nausea and vomiting.  Musculoskeletal:  Positive for arthralgias, back pain, myalgias and neck pain.  Neurological:  Positive for headaches. Negative for dizziness and light-headedness.    Physical Exam Triage Vital Signs ED Triage Vitals  Enc Vitals Group     BP 05/11/21 1903 (!) 165/76     Pulse Rate 05/11/21 1903 (!) 50     Resp 05/11/21 1903 18     Temp 05/11/21 1903 98.7 F (37.1 C)  Temp Source 05/11/21 1903 Oral     SpO2 05/11/21 1903 98 %     Weight --      Height --      Head Circumference --      Peak Flow --      Pain Score 05/11/21 1901 4     Pain Loc --      Pain Edu? --      Excl. in [place]? --    No data found.  Updated Vital Signs BP (!) 165/76 (BP Location: Right Arm)    Pulse (!) 50    Temp 98.7 F (37.1 C) (Oral)    Resp 18    SpO2 98%   Visual Acuity Right Eye Distance:   Left Eye Distance:   Bilateral Distance:    Right Eye Near:   Left Eye Near:    Bilateral Near:     Physical Exam Vitals reviewed.  Constitutional:      General: She is awake. She is not in acute distress.     Appearance: Normal appearance. She is well-developed. She is not ill-appearing.     Comments: Very pleasant female appears stated age in no acute distress sitting comfortably in exam room  HENT:     Head: Normocephalic and atraumatic.     Right Ear: Tympanic membrane, ear canal and external ear normal. Tympanic membrane is not erythematous or bulging.     Left Ear: Tympanic membrane, ear canal and external ear normal. Tympanic membrane is not erythematous or bulging.     Nose:     Right Sinus: Maxillary sinus tenderness present. No frontal sinus tenderness.     Left Sinus: Maxillary sinus tenderness present. No frontal sinus tenderness.     Mouth/Throat:     Dentition: Has dentures.     Pharynx: Uvula midline. Posterior oropharyngeal erythema present. No oropharyngeal exudate.     Comments: Erythema and drainage in posterior oropharynx Cardiovascular:     Rate and Rhythm: Normal rate and regular rhythm.     Heart sounds: Normal heart sounds, S1 normal and S2 normal. No murmur heard. Pulmonary:     Effort: Pulmonary effort is normal.     Breath sounds: Normal breath sounds. No wheezing, rhonchi or rales.     Comments: Clear to auscultation bilaterally Psychiatric:        Behavior: Behavior is cooperative.     UC Treatments / Results  Labs (all labs ordered are listed, but only abnormal results are displayed) Labs Reviewed - No data to display  EKG   Radiology No results found.  Procedures Procedures (including critical care time)  Medications Ordered in UC Medications - No data to display  Initial Impression / Assessment and Plan / UC Course  I have reviewed the triage vital signs and the nursing notes.  Pertinent labs & imaging results that were available during my care of the patient were reviewed by me and considered in my medical decision making (see chart for details).     No indication for viral testing given patient has been symptomatic for several weeks this  would not change management.  Given prolonged symptoms will consider trial of antibiotics and patient was prescribed Augmentin.  She can use over-the-counter medication including Flonase and Mucinex for additional symptom relief.  Recommend she rest and drink plenty of fluid.  X-ray of chest was deferred given minimal cough symptoms, no adventitious sounds on exam, pulse oxygenation of 98%.  Discussed that if she has worsening symptoms  including chest pain, shortness of breath, fever not responding to medication, weakness, nausea/vomiting she needs to be seen immediately.  Recommend she follow-up with either clinic or primary care if she has not had improvement of symptoms within a week.  Strict return precautions given to which she expressed understanding.  Final Clinical Impressions(s) / UC Diagnoses   Final diagnoses:  Acute non-recurrent pansinusitis  Nasal congestion  Malaise and fatigue     Discharge Instructions      Start Augmentin twice daily for 7 days.  Use Mucinex, Flonase, Tylenol for additional symptom relief.  Make sure you rest and drink plenty of fluid.  If your symptoms are not improving within 1 week please return here or see your PCP.  If you have any worsening symptoms including fever not responding to medication, weakness, nausea/vomiting interfering with oral intake, shortness of breath, chest pain, worsening cough you need to be seen in the emergency room.     ED Prescriptions     Medication Sig Dispense Auth. Provider   amoxicillin-clavulanate (AUGMENTIN) 875-125 MG tablet Take 1 tablet by mouth every 12 (twelve) hours. 14 tablet Tritia Endo, Noberto Retort, PA-C      PDMP not reviewed this encounter.   Jeani Hawking, PA-C 05/11/21 1954

## 2021-05-11 NOTE — ED Triage Notes (Signed)
Patient presents to Urgent Care with complaints of low grade fever, fatigued since 12 days ago. Patient reports first having headache, sore throat, body aches, runny nose. Has had 2 negative home Covid test. She states she doesn't feel good. Has not taken OTC medication for symptoms.

## 2021-05-11 NOTE — Discharge Instructions (Addendum)
Start Augmentin twice daily for 7 days.  Use Mucinex, Flonase, Tylenol for additional symptom relief.  Make sure you rest and drink plenty of fluid.  If your symptoms are not improving within 1 week please return here or see your PCP.  If you have any worsening symptoms including fever not responding to medication, weakness, nausea/vomiting interfering with oral intake, shortness of breath, chest pain, worsening cough you need to be seen in the emergency room.

## 2021-05-15 DIAGNOSIS — G4700 Insomnia, unspecified: Secondary | ICD-10-CM | POA: Diagnosis not present

## 2021-05-15 DIAGNOSIS — M170 Bilateral primary osteoarthritis of knee: Secondary | ICD-10-CM | POA: Diagnosis not present

## 2021-05-15 DIAGNOSIS — G894 Chronic pain syndrome: Secondary | ICD-10-CM | POA: Diagnosis not present

## 2021-05-15 DIAGNOSIS — M47816 Spondylosis without myelopathy or radiculopathy, lumbar region: Secondary | ICD-10-CM | POA: Diagnosis not present

## 2021-06-14 DIAGNOSIS — M47816 Spondylosis without myelopathy or radiculopathy, lumbar region: Secondary | ICD-10-CM | POA: Diagnosis not present

## 2021-06-14 DIAGNOSIS — G4700 Insomnia, unspecified: Secondary | ICD-10-CM | POA: Diagnosis not present

## 2021-06-14 DIAGNOSIS — M170 Bilateral primary osteoarthritis of knee: Secondary | ICD-10-CM | POA: Diagnosis not present

## 2021-06-14 DIAGNOSIS — G894 Chronic pain syndrome: Secondary | ICD-10-CM | POA: Diagnosis not present

## 2021-07-17 DIAGNOSIS — M170 Bilateral primary osteoarthritis of knee: Secondary | ICD-10-CM | POA: Diagnosis not present

## 2021-07-17 DIAGNOSIS — G894 Chronic pain syndrome: Secondary | ICD-10-CM | POA: Diagnosis not present

## 2021-07-17 DIAGNOSIS — M47816 Spondylosis without myelopathy or radiculopathy, lumbar region: Secondary | ICD-10-CM | POA: Diagnosis not present

## 2021-07-17 DIAGNOSIS — G4700 Insomnia, unspecified: Secondary | ICD-10-CM | POA: Diagnosis not present

## 2021-08-15 DIAGNOSIS — G4700 Insomnia, unspecified: Secondary | ICD-10-CM | POA: Diagnosis not present

## 2021-08-15 DIAGNOSIS — G894 Chronic pain syndrome: Secondary | ICD-10-CM | POA: Diagnosis not present

## 2021-08-15 DIAGNOSIS — M47816 Spondylosis without myelopathy or radiculopathy, lumbar region: Secondary | ICD-10-CM | POA: Diagnosis not present

## 2021-08-15 DIAGNOSIS — M170 Bilateral primary osteoarthritis of knee: Secondary | ICD-10-CM | POA: Diagnosis not present

## 2021-09-08 ENCOUNTER — Telehealth: Payer: Self-pay | Admitting: Family Medicine

## 2021-09-08 NOTE — Telephone Encounter (Signed)
Please call patient and remind her that she is due for mammogram and colon cancer screening.  I know we had previously ordered a Cologuard for her.  If she needs [place] to place a new order we are happy to do so and we would love to get her scheduled for mammogram downstairs. ?

## 2021-09-12 DIAGNOSIS — G4700 Insomnia, unspecified: Secondary | ICD-10-CM | POA: Diagnosis not present

## 2021-09-12 DIAGNOSIS — M47816 Spondylosis without myelopathy or radiculopathy, lumbar region: Secondary | ICD-10-CM | POA: Diagnosis not present

## 2021-09-12 DIAGNOSIS — M170 Bilateral primary osteoarthritis of knee: Secondary | ICD-10-CM | POA: Diagnosis not present

## 2021-09-12 DIAGNOSIS — G894 Chronic pain syndrome: Secondary | ICD-10-CM | POA: Diagnosis not present

## 2021-09-14 NOTE — Telephone Encounter (Signed)
Left message for patient to return call.

## 2021-09-21 NOTE — Telephone Encounter (Signed)
LVM advising pt to rtn call about cologuard and mammogram ?

## 2021-09-27 NOTE — Telephone Encounter (Signed)
LVM for pt to call to discuss.  T. Amairani Shuey, CMA  

## 2021-09-29 NOTE — Telephone Encounter (Signed)
Letter mailed to pt since we have not received a return call.  Charyl Bigger, CMA

## 2021-10-16 DIAGNOSIS — G894 Chronic pain syndrome: Secondary | ICD-10-CM | POA: Diagnosis not present

## 2021-10-16 DIAGNOSIS — M170 Bilateral primary osteoarthritis of knee: Secondary | ICD-10-CM | POA: Diagnosis not present

## 2021-10-16 DIAGNOSIS — M47816 Spondylosis without myelopathy or radiculopathy, lumbar region: Secondary | ICD-10-CM | POA: Diagnosis not present

## 2021-10-16 DIAGNOSIS — G4700 Insomnia, unspecified: Secondary | ICD-10-CM | POA: Diagnosis not present

## 2021-11-23 DIAGNOSIS — G4700 Insomnia, unspecified: Secondary | ICD-10-CM | POA: Diagnosis not present

## 2021-11-23 DIAGNOSIS — M47816 Spondylosis without myelopathy or radiculopathy, lumbar region: Secondary | ICD-10-CM | POA: Diagnosis not present

## 2021-11-23 DIAGNOSIS — G894 Chronic pain syndrome: Secondary | ICD-10-CM | POA: Diagnosis not present

## 2021-11-23 DIAGNOSIS — M170 Bilateral primary osteoarthritis of knee: Secondary | ICD-10-CM | POA: Diagnosis not present

## 2021-11-23 DIAGNOSIS — Z79891 Long term (current) use of opiate analgesic: Secondary | ICD-10-CM | POA: Diagnosis not present

## 2021-12-21 DIAGNOSIS — G894 Chronic pain syndrome: Secondary | ICD-10-CM | POA: Diagnosis not present

## 2021-12-21 DIAGNOSIS — M170 Bilateral primary osteoarthritis of knee: Secondary | ICD-10-CM | POA: Diagnosis not present

## 2021-12-21 DIAGNOSIS — M47816 Spondylosis without myelopathy or radiculopathy, lumbar region: Secondary | ICD-10-CM | POA: Diagnosis not present

## 2021-12-21 DIAGNOSIS — G4700 Insomnia, unspecified: Secondary | ICD-10-CM | POA: Diagnosis not present

## 2022-01-16 DIAGNOSIS — G4700 Insomnia, unspecified: Secondary | ICD-10-CM | POA: Diagnosis not present

## 2022-01-16 DIAGNOSIS — G894 Chronic pain syndrome: Secondary | ICD-10-CM | POA: Diagnosis not present

## 2022-01-16 DIAGNOSIS — M47816 Spondylosis without myelopathy or radiculopathy, lumbar region: Secondary | ICD-10-CM | POA: Diagnosis not present

## 2022-01-16 DIAGNOSIS — M170 Bilateral primary osteoarthritis of knee: Secondary | ICD-10-CM | POA: Diagnosis not present

## 2022-02-14 DIAGNOSIS — G894 Chronic pain syndrome: Secondary | ICD-10-CM | POA: Diagnosis not present

## 2022-02-14 DIAGNOSIS — G4700 Insomnia, unspecified: Secondary | ICD-10-CM | POA: Diagnosis not present

## 2022-02-14 DIAGNOSIS — M47816 Spondylosis without myelopathy or radiculopathy, lumbar region: Secondary | ICD-10-CM | POA: Diagnosis not present

## 2022-02-14 DIAGNOSIS — M170 Bilateral primary osteoarthritis of knee: Secondary | ICD-10-CM | POA: Diagnosis not present

## 2022-03-01 ENCOUNTER — Other Ambulatory Visit (HOSPITAL_COMMUNITY): Payer: Self-pay

## 2022-03-01 MED ORDER — HYDROCODONE-ACETAMINOPHEN 10-325 MG PO TABS
ORAL_TABLET | ORAL | 0 refills | Status: DC
  Filled 2022-03-01: qty 150, 30d supply, fill #0

## 2022-03-19 DIAGNOSIS — M47816 Spondylosis without myelopathy or radiculopathy, lumbar region: Secondary | ICD-10-CM | POA: Diagnosis not present

## 2022-03-19 DIAGNOSIS — M170 Bilateral primary osteoarthritis of knee: Secondary | ICD-10-CM | POA: Diagnosis not present

## 2022-03-19 DIAGNOSIS — G4700 Insomnia, unspecified: Secondary | ICD-10-CM | POA: Diagnosis not present

## 2022-03-19 DIAGNOSIS — G894 Chronic pain syndrome: Secondary | ICD-10-CM | POA: Diagnosis not present

## 2022-03-29 ENCOUNTER — Other Ambulatory Visit (HOSPITAL_COMMUNITY): Payer: Self-pay

## 2022-03-29 MED ORDER — HYDROCODONE-ACETAMINOPHEN 10-325 MG PO TABS
1.0000 | ORAL_TABLET | Freq: Every day | ORAL | 0 refills | Status: AC | PRN
  Filled 2022-03-29: qty 150, 30d supply, fill #0

## 2022-03-30 ENCOUNTER — Other Ambulatory Visit (HOSPITAL_COMMUNITY): Payer: Self-pay

## 2022-03-31 ENCOUNTER — Other Ambulatory Visit (HOSPITAL_COMMUNITY): Payer: Self-pay

## 2022-04-09 ENCOUNTER — Ambulatory Visit (INDEPENDENT_AMBULATORY_CARE_PROVIDER_SITE_OTHER): Payer: Medicare Other | Admitting: Family Medicine

## 2022-04-09 DIAGNOSIS — Z0000 Encounter for general adult medical examination without abnormal findings: Secondary | ICD-10-CM | POA: Diagnosis not present

## 2022-04-09 DIAGNOSIS — Z1211 Encounter for screening for malignant neoplasm of colon: Secondary | ICD-10-CM | POA: Diagnosis not present

## 2022-04-09 DIAGNOSIS — Z1231 Encounter for screening mammogram for malignant neoplasm of breast: Secondary | ICD-10-CM

## 2022-04-09 DIAGNOSIS — Z122 Encounter for screening for malignant neoplasm of respiratory organs: Secondary | ICD-10-CM | POA: Diagnosis not present

## 2022-04-09 DIAGNOSIS — F17200 Nicotine dependence, unspecified, uncomplicated: Secondary | ICD-10-CM

## 2022-04-09 NOTE — Addendum Note (Signed)
Addended by: Modesto Charon on: 04/09/2022 03:17 PM   Modules accepted: Level of Service

## 2022-04-09 NOTE — Progress Notes (Signed)
MEDICARE ANNUAL WELLNESS VISIT  04/09/2022  Telephone Visit Disclaimer This Medicare AWV was conducted by telephone due to national recommendations for restrictions regarding the COVID-19 Pandemic (e.g. social distancing).  I verified, using two identifiers, that I am speaking with Rosezetta Schlatter or their authorized healthcare agent. I discussed the limitations, risks, security, and privacy concerns of performing an evaluation and management service by telephone and the potential availability of an in-person appointment in the future. The patient expressed understanding and agreed to proceed.  Location of Patient: Home Location of Provider (nurse):  In the office.  Subjective:    DARE SPILLMAN is a 61 y.o. female patient of Metheney, Barbarann Ehlers, MD who had a Medicare Annual Wellness Visit today via telephone. Tonea is Disabled and lives with their family. she has 3 children. she reports that she is socially active and does interact with friends/family regularly. she is minimally physically active and enjoys crafts.  Patient Care Team: Agapito Games, MD as PCP - General     04/09/2022    2:57 PM  Advanced Directives  Does Patient Have a Medical Advance Directive? No  Would patient like information on creating a medical advance directive? No - Patient declined    Hospital Utilization Over the Past 12 Months: # of hospitalizations or ER visits: 1 # of surgeries: 0  Review of Systems    Patient reports that her overall health is unchanged compared to last year.  History obtained from chart review and the patient  Patient Reported Readings (BP, Pulse, CBG, Weight, etc) none  Pain Assessment Pain : 0-10 Pain Score: 4  Pain Type: Chronic pain Pain Location: Back Pain Orientation: Upper, Lower Pain Radiating Towards: legs and calves Pain Descriptors / Indicators: Constant Pain Onset: More than a month ago Pain Frequency: Constant Pain Relieving Factors: pain  medication.  Pain Relieving Factors: pain medication.  Current Medications & Allergies (verified) Allergies as of 04/09/2022       Reactions   Morphine And Related Itching   Acetaminophen-codeine    REACTION: Rash and itching        Medication List        Accurate as of April 09, 2022  3:15 PM. If you have any questions, ask your nurse or doctor.          amoxicillin-clavulanate 875-125 MG tablet Commonly known as: AUGMENTIN Take 1 tablet by mouth every 12 (twelve) hours.   HYDROcodone-acetaminophen 10-325 MG tablet Commonly known as: NORCO Take 1 tablet by mouth every 6 (six) hours as needed. What changed:  Another medication with the same name was changed. Make sure you understand how and when to take each. Another medication with the same name was removed. Continue taking this medication, and follow the directions you see here.   HYDROcodone-acetaminophen 10-325 MG tablet Commonly known as: NORCO Take 1 tablet by mouth 5 (five) times daily as needed. What changed:  additional instructions Another medication with the same name was removed. Continue taking this medication, and follow the directions you see here.   methadone 10 MG tablet Commonly known as: DOLOPHINE Take 10 mg by mouth every 8 (eight) hours.   zolpidem 10 MG tablet Commonly known as: AMBIEN Take 10 mg by mouth at bedtime as needed.        History (reviewed): Past Medical History:  Diagnosis Date   Back pain    Fibromyalgia    Thyroid disease    Past Surgical History:  Procedure Laterality  Date   CESAREAN SECTION     Family History  Problem Relation Age of Onset   Heart attack Mother    Kidney failure Mother    Healthy Father    Social History   Socioeconomic History   Marital status: Divorced    Spouse name: Not on file   Number of children: 3   Years of education: 10   Highest education level: 10th grade  Occupational History   Occupation: disabled.  Tobacco Use    Smoking status: Every Day    Packs/day: 1.50    Types: Cigarettes   Smokeless tobacco: Never  Vaping Use   Vaping Use: Never used  Substance and Sexual Activity   Alcohol use: No   Drug use: Not Currently   Sexual activity: Not on file  Other Topics Concern   Not on file  Social History Narrative   2 of her children and 4 grandchildren live with her. She enjoys doing crafts.   Social Determinants of Health   Financial Resource Strain: Low Risk  (04/09/2022)   Overall Financial Resource Strain (CARDIA)    Difficulty of Paying Living Expenses: Not hard at all  Food Insecurity: No Food Insecurity (04/09/2022)   Hunger Vital Sign    Worried About Running Out of Food in the Last Year: Never true    Ran Out of Food in the Last Year: Never true  Transportation Needs: No Transportation Needs (04/09/2022)   PRAPARE - Hydrologist (Medical): No    Lack of Transportation (Non-Medical): No  Physical Activity: Inactive (04/09/2022)   Exercise Vital Sign    Days of Exercise per Week: 0 days    Minutes of Exercise per Session: 0 min  Stress: Stress Concern Present (04/09/2022)   [place]    Feeling of Stress : To some extent  Social Connections: Socially Isolated (04/09/2022)   Social Connection and Isolation Panel [NHANES]    Frequency of Communication with Friends and Family: More than three times a week    Frequency of Social Gatherings with Friends and Family: More than three times a week    Attends Religious Services: Never    Marine scientist or Organizations: No    Attends Archivist Meetings: Never    Marital Status: Divorced    Activities of Daily Living    04/09/2022    3:04 PM  In your present state of health, do you have any difficulty performing the following activities:  Hearing? 0  Vision? 0  Difficulty concentrating or making decisions? 0  Walking or  climbing stairs? 0  Dressing or bathing? 0  Doing errands, shopping? 0  Preparing Food and eating ? N  Using the Toilet? N  In the past six months, have you accidently leaked urine? N  Do you have problems with loss of bowel control? N  Managing your Medications? N  Managing your Finances? N  Housekeeping or managing your Housekeeping? N    Patient Education/ Literacy How often do you need to have someone help you when you read instructions, pamphlets, or other written materials from your doctor or pharmacy?: 1 - Never What is the last grade level you completed in school?: 10th grade  Exercise Current Exercise Habits: The patient does not participate in regular exercise at present, Exercise limited by: orthopedic condition(s) (back pain.)  Diet Patient reports consuming 1 meals a day and 0 snack(s) a day  Patient reports that her primary diet is: Regular Patient reports that she does have regular access to food.   Depression Screen    04/09/2022    3:01 PM  PHQ 2/9 Scores  PHQ - 2 Score 0     Fall Risk    04/09/2022    3:01 PM 09/18/2019    1:19 PM  [place] in the past year? 0 0  Number falls in past yr: 0 0  Injury with Fall? 0   Risk for fall due to : No Fall Risks No Fall Risks  Follow up Falls evaluation completed      Objective:  HUNTLEIGH PEKRUL seemed alert and oriented and she participated appropriately during our telephone visit.  Blood Pressure Weight BMI  BP Readings from Last 3 Encounters:  05/11/21 (!) 165/76  12/07/20 129/82  11/22/20 (!) 149/87   Wt Readings from Last 3 Encounters:  12/07/20 130 lb (59 kg)  11/22/20 130 lb (59 kg)  09/18/19 143 lb (64.9 kg)   BMI Readings from Last 1 Encounters:  12/07/20 23.78 kg/m    *Unable to obtain current vital signs, weight, and BMI due to telephone visit type  Hearing/Vision  Greidis did not seem to have difficulty with hearing/understanding during the telephone conversation Reports that she  has not had a formal eye exam by an eye care professional within the past year Reports that she has not had a formal hearing evaluation within the past year *Unable to fully assess hearing and vision during telephone visit type  Cognitive Function:    04/09/2022    3:05 PM  6CIT Screen  What Year? 0 points  What month? 0 points  What time? 0 points  Count back from 20 0 points  Months in reverse 0 points  Repeat phrase 0 points  Total Score 0 points   (Normal:0-7, Significant for Dysfunction: >8)  Normal Cognitive Function Screening: Yes   Immunization & Health Maintenance Record  There is no immunization history on file for this patient.  Health Maintenance  Topic Date Due   PAP SMEAR-Modifier  04/09/2022 (Originally 06/20/1982)   COVID-19 Vaccine (1) 04/25/2022 (Originally [birth year]/20/1966)   Zoster Vaccines- Shingrix (1 of 2) 07/10/2022 (Originally 06/20/1980)   INFLUENZA VACCINE  08/12/2022 (Originally 12/12/2021)   Pneumococcal Vaccine 59-2 Years old (1 - PCV) 04/10/2023 (Originally 06/21/1967)   MAMMOGRAM  04/10/2023 (Originally 06/21/2011)   COLONOSCOPY (Pts 45-43yrs Insurance coverage will need to be confirmed)  04/10/2023 (Originally 06/20/2006)   Hepatitis C Screening  04/10/2023 (Originally 06/21/1979)   HIV Screening  04/10/2023 (Originally 06/20/1976)   Medicare Annual Wellness (AWV)  04/10/2023   HPV VACCINES  Aged Out       Assessment  This is a routine wellness examination for CLO ALDI.  Health Maintenance: Due or Overdue There are no preventive care reminders to display for this patient.   Micah Flesher does not need a referral for Community Assistance: Care Management:   no Social Work:    no Prescription Assistance:  no Nutrition/Diabetes Education:  no   Plan:  Personalized Goals  Goals Addressed               This Visit's Progress     Patient Stated (pt-stated)        04/09/2022 AWV Goal: Tobacco Cessation  Smoking cessation  instruction/counseling given:  counseled patient on the dangers of tobacco use, advised patient to stop smoking, and reviewed strategies to maximize success  Patient will verbalize understanding of the health risks associated with smoking/tobacco use Lung cancer or lung disease, such as COPD Heart disease. Stroke. Heart attack Infertility Osteoporosis and bone fractures. Patient will create a plan to quit smoking/using tobacco Pick a date to quit.  Write down the reasons why you are quitting and put it where you will see it often. Identify the people, places, things, and activities that make you want to smoke (triggers) and avoid them. Make sure to take these actions: Throw away all cigarettes at home, at work, and in your car. Throw away smoking accessories, such as Scientist, research (medical). Clean your car and make sure to empty the ashtray. Clean your home, including curtains and carpets. Tell your family, friends, and coworkers that you are quitting. Support from your loved ones can make quitting easier. Talk with your health care provider about your options for quitting smoking. Find out what treatment options are covered by your health insurance. Patient will be able to demonstrate knowledge of tobacco cessation strategies that may maximize success Quitting "cold [place]" is more successful than gradually quitting. Attending in-person counseling to help you build problem-solving skills.  Finding resources and support systems that can help you to quit smoking and remain smoke-free after you quit. These resources are most helpful when you use them often. They can include: Online chats with a Social worker. Telephone quitlines. Printed Furniture conservator/restorer. Support groups or group counseling. Text messaging programs. Mobile phone applications. Taking medicines to help you quit smoking: Nicotine patches, gum, or lozenges. Nicotine inhalers or sprays. Non-nicotine medicine that is taken by  mouth. Patient will note get discouraged if the process is difficult Over the next year, patient will stop smoking or using other forms of tobacco  Smoking cessation instruction/counseling given:  counseled patient on the dangers of tobacco use, advised patient to stop smoking, and reviewed strategies to maximize success         Personalized Health Maintenance & Screening Recommendations  Pneumococcal vaccine  Influenza vaccine Screening Pap smear and pelvic exam  Colorectal cancer screening Shingles vaccine  Patient declined the vaccines at this time.  Lung Cancer Screening Recommended: yes (Low Dose CT Chest recommended if Age 41-80 years, 30 pack-year currently smoking OR have quit w/in past 15 years) Hepatitis C Screening recommended: yes HIV Screening recommended: yes  Advanced Directives: Written information was not prepared per patient's request.  Referrals & Orders Orders Placed This Encounter  Procedures   Mammogram 3D SCREEN BREAST BILATERAL   Cologuard   AMB Referral to [place]   Ambulatory Referral for Lung Cancer Scre    Follow-up Plan Follow-up with Hali Marry, MD as planned Referrals have been placed as discussed.  Medicare wellness visit in one year. AVS printed and mailed to the patient.   I have personally reviewed and noted the following in the patient's chart:   Medical and social history Use of alcohol, tobacco or illicit drugs  Current medications and supplements Functional ability and status Nutritional status Physical activity Advanced directives List of other physicians Hospitalizations, surgeries, and ER visits in previous 12 months Vitals Screenings to include cognitive, depression, and falls Referrals and appointments  In addition, I have reviewed and discussed with Micah Flesher certain preventive protocols, quality metrics, and best practice recommendations. A written personalized care plan for  preventive services as well as general preventive health recommendations is available and can be mailed to the patient at her request.      Novella Rob  [facility], RN BSN  04/09/2022

## 2022-04-09 NOTE — Patient Instructions (Signed)
MEDICARE ANNUAL WELLNESS VISIT Health Maintenance Summary and Written Plan of Care  Ms. Bowker ,  Thank you for allowing me to perform your Medicare Annual Wellness Visit and for your ongoing commitment to your health.   Health Maintenance & Immunization History Health Maintenance  Topic Date Due   PAP SMEAR-Modifier  04/09/2022 (Originally 06/20/1982)   COVID-19 Vaccine (1) 04/25/2022 (Originally 06/20/1966)   Zoster Vaccines- Shingrix (1 of 2) 07/10/2022 (Originally 06/20/1980)   INFLUENZA VACCINE  08/12/2022 (Originally 12/12/2021)   Pneumococcal Vaccine 55-11 Years old (1 - PCV) 04/10/2023 (Originally 06/21/1967)   MAMMOGRAM  04/10/2023 (Originally 06/21/2011)   COLONOSCOPY (Pts 45-4yrs Insurance coverage will need to be confirmed)  04/10/2023 (Originally 06/20/2006)   Hepatitis C Screening  04/10/2023 (Originally 06/21/1979)   HIV Screening  04/10/2023 (Originally 06/20/1976)   Medicare Annual Wellness (AWV)  04/10/2023   HPV VACCINES  Aged Out    There is no immunization history on file for this patient.  These are the patient goals that we discussed:  Goals Addressed               This Visit's Progress     Patient Stated (pt-stated)        04/09/2022 AWV Goal: Tobacco Cessation  Smoking cessation instruction/counseling given:  counseled patient on the dangers of tobacco use, advised patient to stop smoking, and reviewed strategies to maximize success  Patient will verbalize understanding of the health risks associated with smoking/tobacco use Lung cancer or lung disease, such as COPD Heart disease. Stroke. Heart attack Infertility Osteoporosis and bone fractures. Patient will create a plan to quit smoking/using tobacco Pick a date to quit.  Write down the reasons why you are quitting and put it where you will see it often. Identify the people, places, things, and activities that make you want to smoke (triggers) and avoid them. Make sure to take these actions: Throw away all  cigarettes at home, at work, and in your car. Throw away smoking accessories, such as Set designer. Clean your car and make sure to empty the ashtray. Clean your home, including curtains and carpets. Tell your family, friends, and coworkers that you are quitting. Support from your loved ones can make quitting easier. Talk with your health care provider about your options for quitting smoking. Find out what treatment options are covered by your health insurance. Patient will be able to demonstrate knowledge of tobacco cessation strategies that may maximize success Quitting "cold [place]" is more successful than gradually quitting. Attending in-person counseling to help you build problem-solving skills.  Finding resources and support systems that can help you to quit smoking and remain smoke-free after you quit. These resources are most helpful when you use them often. They can include: Online chats with a Veterinary surgeon. Telephone quitlines. Printed Materials engineer. Support groups or group counseling. Text messaging programs. Mobile phone applications. Taking medicines to help you quit smoking: Nicotine patches, gum, or lozenges. Nicotine inhalers or sprays. Non-nicotine medicine that is taken by mouth. Patient will note get discouraged if the process is difficult Over the next year, patient will stop smoking or using other forms of tobacco  Smoking cessation instruction/counseling given:  counseled patient on the dangers of tobacco use, advised patient to stop smoking, and reviewed strategies to maximize success           This is a list of Health Maintenance Items that are overdue or due now: Pneumococcal vaccine  Influenza vaccine Screening Pap smear and pelvic exam  Colorectal cancer screening Shingles vaccine  Patient declined the vaccines at this time.There are no preventive care reminders to display for this patient.    Orders/Referrals Placed Today: Orders Placed  This Encounter  Procedures   Mammogram 3D SCREEN BREAST BILATERAL    Standing Status:   Future    Standing Expiration Date:   04/10/2023    Scheduling Instructions:     Please call patient to schedule.    Order Specific Question:   Reason for Exam (SYMPTOM  OR DIAGNOSIS REQUIRED)    Answer:   breast cancer screening    Order Specific Question:   Is the patient pregnant?    Answer:   No    Order Specific Question:   Preferred imaging location?    Answer:   [facility]   Cologuard   AMB Referral to [facility]    Referral Priority:   Routine    Referral Type:   Consultation    Referral Reason:   Care Coordination    Number of Visits Requested:   1   Ambulatory Referral for Lung Cancer Scre    Referral Priority:   Routine    Referral Type:   Consultation    Referral Reason:   Specialty Services Required    Number of Visits Requested:   1   (Contact our referral department at [phone] if you have not spoken with someone about your referral appointment within the next 5 days)    Follow-up Plan Follow-up with Hali Marry, MD as planned Referrals have been placed as discussed.  Medicare wellness visit in one year. AVS printed and mailed to the patient.      Health Maintenance, Female Adopting a healthy lifestyle and getting preventive care are important in promoting health and wellness. Ask your health care provider about: The right schedule for you to have regular tests and exams. Things you can do on your own to prevent diseases and keep yourself healthy. What should I know about diet, weight, and exercise? Eat a healthy diet  Eat a diet that includes plenty of vegetables, fruits, low-fat dairy products, and lean protein. Do not eat a lot of foods that are high in solid fats, added sugars, or sodium. Maintain a healthy weight Body mass index (BMI) is used to identify weight problems. It estimates body fat based on height and weight.  Your health care provider can help determine your BMI and help you achieve or maintain a healthy weight. Get regular exercise Get regular exercise. This is one of the most important things you can do for your health. Most adults should: Exercise for at least 150 minutes each week. The exercise should increase your heart rate and make you sweat (moderate-intensity exercise). Do strengthening exercises at least twice a week. This is in addition to the moderate-intensity exercise. Spend less time sitting. Even light physical activity can be beneficial. Watch cholesterol and blood lipids Have your blood tested for lipids and cholesterol at 60 years of age, then have this test every 5 years. Have your cholesterol levels checked more often if: Your lipid or cholesterol levels are high. You are older than 60 years of age. You are at high risk for heart disease. What should I know about cancer screening? Depending on your health history and family history, you may need to have cancer screening at various ages. This may include screening for: Breast cancer. Cervical cancer. Colorectal cancer. Skin cancer. Lung cancer. What should I know about heart disease,  diabetes, and high blood pressure? Blood pressure and heart disease High blood pressure causes heart disease and increases the risk of stroke. This is more likely to develop in people who have high blood pressure readings or are overweight. Have your blood pressure checked: Every 3-5 years if you are 94-31 years of age. Every year if you are 40 years old or older. Diabetes Have regular diabetes screenings. This checks your fasting blood sugar level. Have the screening done: Once every three years after age 79 if you are at a normal weight and have a low risk for diabetes. More often and at a younger age if you are overweight or have a high risk for diabetes. What should I know about preventing infection? Hepatitis B If you have a higher risk  for hepatitis B, you should be screened for this virus. Talk with your health care provider to find out if you are at risk for hepatitis B infection. Hepatitis C Testing is recommended for: Everyone born from [birth year] through 1964. Anyone with known risk factors for hepatitis C. Sexually transmitted infections (STIs) Get screened for STIs, including gonorrhea and chlamydia, if: You are sexually active and are younger than 60 years of age. You are older than 60 years of age and your health care provider tells you that you are at risk for this type of infection. Your sexual activity has changed since you were last screened, and you are at increased risk for chlamydia or gonorrhea. Ask your health care provider if you are at risk. Ask your health care provider about whether you are at high risk for HIV. Your health care provider may recommend a prescription medicine to help prevent HIV infection. If you choose to take medicine to prevent HIV, you should first get tested for HIV. You should then be tested every 3 months for as long as you are taking the medicine. Pregnancy If you are about to stop having your period (premenopausal) and you may become pregnant, seek counseling before you get pregnant. Take 400 to 800 micrograms (mcg) of folic acid every day if you become pregnant. Ask for birth control (contraception) if you want to prevent pregnancy. Osteoporosis and menopause Osteoporosis is a disease in which the bones lose minerals and strength with aging. This can result in bone fractures. If you are 59 years old or older, or if you are at risk for osteoporosis and fractures, ask your health care provider if you should: Be screened for bone loss. Take a calcium or vitamin D supplement to lower your risk of fractures. Be given hormone replacement therapy (HRT) to treat symptoms of menopause. Follow these instructions at home: Alcohol use Do not drink alcohol if: Your health care provider tells you  not to drink. You are pregnant, may be pregnant, or are planning to become pregnant. If you drink alcohol: Limit how much you have to: 0-1 drink a day. Know how much alcohol is in your drink. In the [place] one drink equals one 12 oz bottle of beer (355 mL), one 5 oz glass of wine (148 mL), or one 1 oz glass of hard liquor (44 mL). Lifestyle Do not use any products that contain nicotine or tobacco. These products include cigarettes, chewing tobacco, and vaping devices, such as e-cigarettes. If you need help quitting, ask your health care provider. Do not use street drugs. Do not share needles. Ask your health care provider for help if you need support or information about quitting drugs. General instructions Schedule regular  health, dental, and eye exams. Stay current with your vaccines. Tell your health care provider if: You often feel depressed. You have ever been abused or do not feel safe at home. Summary Adopting a healthy lifestyle and getting preventive care are important in promoting health and wellness. Follow your health care provider's instructions about healthy diet, exercising, and getting tested or screened for diseases. Follow your health care provider's instructions on monitoring your cholesterol and blood pressure. This information is not intended to replace advice given to you by your health care provider. Make sure you discuss any questions you have with your health care provider. Document Revised: 09/19/2020 Document Reviewed: 09/19/2020 Elsevier Patient Education  [place].

## 2022-04-12 ENCOUNTER — Telehealth: Payer: Self-pay | Admitting: *Deleted

## 2022-04-12 NOTE — Progress Notes (Signed)
  Care Coordination   Note   04/12/2022 Name: Carolyn Bauer MRN: [id number] DOB: 1960/06/21  Carolyn Bauer is a 61 y.o. year old female who sees Metheney, Barbarann Ehlers, MD for primary care. I reached out to Carolyn Bauer by phone today to offer care coordination services.  Carolyn Bauer was given information about Care Coordination services today including:   The Care Coordination services include support from the care team which includes your Nurse Coordinator, Clinical Social Worker, or Pharmacist.  The Care Coordination team is here to help remove barriers to the health concerns and goals most important to you. Care Coordination services are voluntary, and the patient may decline or stop services at any time by request to their care team member.   Care Coordination Consent Status: Patient did not agree to participate in care coordination services at this time.  Follow up plan:  pt says she has recently received additional resources for financial support - services not needed at this time   Encounter Outcome:  Pt. Refused  Burman Nieves, CCMA Care Coordination Care Guide Direct Dial: [phone]

## 2022-04-18 ENCOUNTER — Other Ambulatory Visit (HOSPITAL_COMMUNITY): Payer: Self-pay

## 2022-04-18 DIAGNOSIS — M47816 Spondylosis without myelopathy or radiculopathy, lumbar region: Secondary | ICD-10-CM | POA: Diagnosis not present

## 2022-04-18 DIAGNOSIS — M170 Bilateral primary osteoarthritis of knee: Secondary | ICD-10-CM | POA: Diagnosis not present

## 2022-04-18 DIAGNOSIS — G894 Chronic pain syndrome: Secondary | ICD-10-CM | POA: Diagnosis not present

## 2022-04-18 DIAGNOSIS — G4700 Insomnia, unspecified: Secondary | ICD-10-CM | POA: Diagnosis not present

## 2022-04-18 MED ORDER — HYDROCODONE-ACETAMINOPHEN 10-325 MG PO TABS
1.0000 | ORAL_TABLET | Freq: Every day | ORAL | 0 refills | Status: DC
  Filled 2022-04-30: qty 150, 30d supply, fill #0

## 2022-04-30 ENCOUNTER — Other Ambulatory Visit: Payer: Self-pay

## 2022-04-30 ENCOUNTER — Other Ambulatory Visit (HOSPITAL_COMMUNITY): Payer: Self-pay

## 2022-05-21 ENCOUNTER — Other Ambulatory Visit (HOSPITAL_COMMUNITY): Payer: Self-pay

## 2022-05-21 DIAGNOSIS — M170 Bilateral primary osteoarthritis of knee: Secondary | ICD-10-CM | POA: Diagnosis not present

## 2022-05-21 DIAGNOSIS — G4700 Insomnia, unspecified: Secondary | ICD-10-CM | POA: Diagnosis not present

## 2022-05-21 DIAGNOSIS — G894 Chronic pain syndrome: Secondary | ICD-10-CM | POA: Diagnosis not present

## 2022-05-21 DIAGNOSIS — M47816 Spondylosis without myelopathy or radiculopathy, lumbar region: Secondary | ICD-10-CM | POA: Diagnosis not present

## 2022-05-21 MED ORDER — HYDROCODONE-ACETAMINOPHEN 10-325 MG PO TABS
1.0000 | ORAL_TABLET | Freq: Every day | ORAL | 0 refills | Status: AC
  Filled 2022-05-30: qty 150, 30d supply, fill #0

## 2022-05-30 ENCOUNTER — Other Ambulatory Visit (HOSPITAL_COMMUNITY): Payer: Self-pay

## 2022-06-18 ENCOUNTER — Other Ambulatory Visit (HOSPITAL_COMMUNITY): Payer: Self-pay

## 2022-06-18 DIAGNOSIS — M47816 Spondylosis without myelopathy or radiculopathy, lumbar region: Secondary | ICD-10-CM | POA: Diagnosis not present

## 2022-06-18 DIAGNOSIS — M170 Bilateral primary osteoarthritis of knee: Secondary | ICD-10-CM | POA: Diagnosis not present

## 2022-06-18 DIAGNOSIS — G4700 Insomnia, unspecified: Secondary | ICD-10-CM | POA: Diagnosis not present

## 2022-06-18 DIAGNOSIS — G894 Chronic pain syndrome: Secondary | ICD-10-CM | POA: Diagnosis not present

## 2022-06-18 MED ORDER — HYDROCODONE-ACETAMINOPHEN 10-325 MG PO TABS
1.0000 | ORAL_TABLET | Freq: Every day | ORAL | 0 refills | Status: AC | PRN
  Filled 2022-06-18: qty 150, 32d supply, fill #0
  Filled 2022-06-27: qty 150, 30d supply, fill #0

## 2022-06-19 ENCOUNTER — Other Ambulatory Visit (HOSPITAL_COMMUNITY): Payer: Self-pay

## 2022-06-27 ENCOUNTER — Other Ambulatory Visit (HOSPITAL_COMMUNITY): Payer: Self-pay

## 2022-07-16 ENCOUNTER — Other Ambulatory Visit (HOSPITAL_COMMUNITY): Payer: Self-pay

## 2022-07-16 DIAGNOSIS — M170 Bilateral primary osteoarthritis of knee: Secondary | ICD-10-CM | POA: Diagnosis not present

## 2022-07-16 DIAGNOSIS — G894 Chronic pain syndrome: Secondary | ICD-10-CM | POA: Diagnosis not present

## 2022-07-16 DIAGNOSIS — G4700 Insomnia, unspecified: Secondary | ICD-10-CM | POA: Diagnosis not present

## 2022-07-16 DIAGNOSIS — M47816 Spondylosis without myelopathy or radiculopathy, lumbar region: Secondary | ICD-10-CM | POA: Diagnosis not present

## 2022-07-16 MED ORDER — HYDROCODONE-ACETAMINOPHEN 10-325 MG PO TABS
1.0000 | ORAL_TABLET | ORAL | 0 refills | Status: AC
  Filled 2022-07-28: qty 150, 30d supply, fill #0

## 2022-07-28 ENCOUNTER — Other Ambulatory Visit (HOSPITAL_COMMUNITY): Payer: Self-pay

## 2022-08-13 ENCOUNTER — Other Ambulatory Visit (HOSPITAL_COMMUNITY): Payer: Self-pay

## 2022-08-13 DIAGNOSIS — M47816 Spondylosis without myelopathy or radiculopathy, lumbar region: Secondary | ICD-10-CM | POA: Diagnosis not present

## 2022-08-13 DIAGNOSIS — M170 Bilateral primary osteoarthritis of knee: Secondary | ICD-10-CM | POA: Diagnosis not present

## 2022-08-13 DIAGNOSIS — G894 Chronic pain syndrome: Secondary | ICD-10-CM | POA: Diagnosis not present

## 2022-08-13 DIAGNOSIS — G4700 Insomnia, unspecified: Secondary | ICD-10-CM | POA: Diagnosis not present

## 2022-08-13 MED ORDER — HYDROCODONE-ACETAMINOPHEN 10-325 MG PO TABS
1.0000 | ORAL_TABLET | Freq: Every day | ORAL | 0 refills | Status: AC | PRN
  Filled 2022-08-27: qty 150, 30d supply, fill #0

## 2022-08-27 ENCOUNTER — Other Ambulatory Visit (HOSPITAL_COMMUNITY): Payer: Self-pay

## 2022-09-10 ENCOUNTER — Other Ambulatory Visit (HOSPITAL_COMMUNITY): Payer: Self-pay

## 2022-09-10 DIAGNOSIS — M47816 Spondylosis without myelopathy or radiculopathy, lumbar region: Secondary | ICD-10-CM | POA: Diagnosis not present

## 2022-09-10 DIAGNOSIS — M170 Bilateral primary osteoarthritis of knee: Secondary | ICD-10-CM | POA: Diagnosis not present

## 2022-09-10 DIAGNOSIS — G894 Chronic pain syndrome: Secondary | ICD-10-CM | POA: Diagnosis not present

## 2022-09-10 DIAGNOSIS — G4700 Insomnia, unspecified: Secondary | ICD-10-CM | POA: Diagnosis not present

## 2022-09-10 MED ORDER — HYDROCODONE-ACETAMINOPHEN 10-325 MG PO TABS
ORAL_TABLET | ORAL | 0 refills | Status: DC
  Filled 2022-09-10 – 2022-09-24 (×3): qty 150, 30d supply, fill #0

## 2022-09-21 ENCOUNTER — Telehealth: Payer: Self-pay | Admitting: Family Medicine

## 2022-09-21 NOTE — Telephone Encounter (Signed)
Pls call pt: needs mammo and f/u BP check.  Also is fi goes to gyn for pap smear?

## 2022-09-21 NOTE — Telephone Encounter (Signed)
Called pt and LVM asking that she RTN call to discuss

## 2022-09-24 ENCOUNTER — Other Ambulatory Visit: Payer: Self-pay

## 2022-09-24 ENCOUNTER — Other Ambulatory Visit (HOSPITAL_COMMUNITY): Payer: Self-pay

## 2022-09-25 ENCOUNTER — Other Ambulatory Visit (HOSPITAL_COMMUNITY): Payer: Self-pay

## 2022-09-25 ENCOUNTER — Other Ambulatory Visit: Payer: Self-pay

## 2022-09-27 NOTE — Telephone Encounter (Signed)
Attempted call to patient . Left voice mail message requesting a return call. Also noted on message that pt could check Mychart for results as well.   

## 2022-10-09 ENCOUNTER — Other Ambulatory Visit (HOSPITAL_COMMUNITY): Payer: Self-pay

## 2022-10-09 DIAGNOSIS — M47816 Spondylosis without myelopathy or radiculopathy, lumbar region: Secondary | ICD-10-CM | POA: Diagnosis not present

## 2022-10-09 DIAGNOSIS — G4700 Insomnia, unspecified: Secondary | ICD-10-CM | POA: Diagnosis not present

## 2022-10-09 DIAGNOSIS — M170 Bilateral primary osteoarthritis of knee: Secondary | ICD-10-CM | POA: Diagnosis not present

## 2022-10-09 DIAGNOSIS — G894 Chronic pain syndrome: Secondary | ICD-10-CM | POA: Diagnosis not present

## 2022-10-09 MED ORDER — HYDROCODONE-ACETAMINOPHEN 10-325 MG PO TABS
ORAL_TABLET | ORAL | 0 refills | Status: AC
  Filled 2022-10-27: qty 150, 30d supply, fill #0

## 2022-10-10 ENCOUNTER — Other Ambulatory Visit (HOSPITAL_COMMUNITY): Payer: Self-pay

## 2022-10-18 NOTE — Telephone Encounter (Signed)
Attempted call again to patient. Left voice mail message requesting a return call.  

## 2022-10-19 NOTE — Telephone Encounter (Signed)
Letter placed in mail to patient.

## 2022-10-27 ENCOUNTER — Other Ambulatory Visit (HOSPITAL_COMMUNITY): Payer: Self-pay

## 2022-11-13 ENCOUNTER — Other Ambulatory Visit (HOSPITAL_COMMUNITY): Payer: Self-pay

## 2022-11-13 DIAGNOSIS — G4700 Insomnia, unspecified: Secondary | ICD-10-CM | POA: Diagnosis not present

## 2022-11-13 DIAGNOSIS — M47816 Spondylosis without myelopathy or radiculopathy, lumbar region: Secondary | ICD-10-CM | POA: Diagnosis not present

## 2022-11-13 DIAGNOSIS — M170 Bilateral primary osteoarthritis of knee: Secondary | ICD-10-CM | POA: Diagnosis not present

## 2022-11-13 DIAGNOSIS — G894 Chronic pain syndrome: Secondary | ICD-10-CM | POA: Diagnosis not present

## 2022-11-13 MED ORDER — HYDROCODONE-ACETAMINOPHEN 10-325 MG PO TABS
1.0000 | ORAL_TABLET | Freq: Every day | ORAL | 0 refills | Status: AC | PRN
  Filled 2022-11-13 – 2022-11-26 (×2): qty 150, 30d supply, fill #0

## 2022-11-15 ENCOUNTER — Ambulatory Visit
Admission: EM | Admit: 2022-11-15 | Discharge: 2022-11-15 | Disposition: A | Discharge status: Home / Self Care | Payer: 59 | Attending: Family Medicine | Admitting: Family Medicine

## 2022-11-15 ENCOUNTER — Encounter: Payer: Self-pay | Admitting: Emergency Medicine

## 2022-11-15 DIAGNOSIS — K122 Cellulitis and abscess of mouth: Secondary | ICD-10-CM

## 2022-11-15 DIAGNOSIS — H6121 Impacted cerumen, right ear: Secondary | ICD-10-CM

## 2022-11-15 DIAGNOSIS — R059 Cough, unspecified: Secondary | ICD-10-CM

## 2022-11-15 LAB — POCT RAPID STREP A (OFFICE): Rapid Strep A Screen: NEGATIVE

## 2022-11-15 MED ORDER — PREDNISONE 10 MG (21) PO TBPK: ORAL_TABLET | Freq: Every day | ORAL | 0 refills | Status: AC

## 2022-11-15 MED ORDER — AMOXICILLIN-POT CLAVULANATE 875-125 MG PO TABS: 1.0000 | ORAL_TABLET | Freq: Two times a day (BID) | ORAL | 0 refills | Status: AC

## 2022-11-15 NOTE — ED Triage Notes (Signed)
Patient c/o sore throat and headache x 1 week.  Patient states that her throat is raw.  Denies any OTC meds.

## 2022-11-15 NOTE — ED Provider Notes (Signed)
[name]    CSN: [id number] Arrival date & time: 11/15/22  1706      History   Chief Complaint Chief Complaint  Patient presents with   Sore Throat    HPI Carolyn Bauer is a 62 y.o. female.   HPI Pleasant 62 year old female presents with sore throat, cough, and headache for 1 week.  Reports her throat is raw. PMH significant for chronic pain syndrome, fibromyalgia, thyroid disease and tobacco dependence.  Past Medical History:  Diagnosis Date   Back pain    Fibromyalgia    Thyroid disease     Patient Active Problem List   Diagnosis Date Noted   Cervical spondylosis 02/14/2018   IRREGULAR MENSTRUATION 12/23/2006   HIP PAIN, RIGHT 12/23/2006   GOITER NOS 02/19/2006   TOBACCO DEPENDENCE 02/19/2006   MENOPAUSAL SYNDROME 02/19/2006   MYALGIA/MYOSITIS, UNSPECIFIED 02/19/2006    Past Surgical History:  Procedure Laterality Date   CESAREAN SECTION      OB History   No obstetric history on file.      Home Medications    Prior to Admission medications   Medication Sig Start Date End Date Taking? Authorizing Provider  amoxicillin-clavulanate (AUGMENTIN) 875-125 MG tablet Take 1 tablet by mouth 2 (two) times daily for 10 days. 11/15/22 11/25/22 Yes Trevor Iha, FNP  HYDROcodone-acetaminophen (NORCO) 10-325 MG tablet Take 1 tablet by mouth 5 (five) times daily as needed. Patient taking differently: Take 1 tablet by mouth 5 (five) times daily as needed. Every 4 hours as needed. 03/29/22  Yes   HYDROcodone-acetaminophen (NORCO) 10-325 MG tablet Take 1 tablet by mouth 5 (five) times daily as needed. 05/21/22  Yes   HYDROcodone-acetaminophen (NORCO) 10-325 MG tablet Take 1 tablet by mouth 5 (five) times daily as needed. 06/18/22  Yes   HYDROcodone-acetaminophen (NORCO) 10-325 MG tablet Take 1 tablet by mouth five times a day as needed. 07/16/22  Yes   HYDROcodone-acetaminophen (NORCO) 10-325 MG tablet Take 1 tablet by mouth 5 (five) times daily as needed. 08/13/22   Yes   HYDROcodone-acetaminophen (NORCO) 10-325 MG tablet Take 1 tablet by mouth five times a day as needed 10/09/22  Yes   HYDROcodone-acetaminophen (NORCO) 10-325 MG tablet Take 1 tablet by mouth 5 (five) times daily as needed. 11/13/22  Yes   methadone (DOLOPHINE) 10 MG tablet Take 10 mg by mouth every 8 (eight) hours.   Yes [provider]  predniSONE (STERAPRED UNI-PAK 21 TAB) 10 MG (21) TBPK tablet Take by mouth daily. Take 6 tabs by mouth daily  for 2 days, then 5 tabs for 2 days, then 4 tabs for 2 days, then 3 tabs for 2 days, 2 tabs for 2 days, then 1 tab by mouth daily for 2 days 11/15/22  Yes Trevor Iha, FNP  zolpidem (AMBIEN) 10 MG tablet Take 10 mg by mouth at bedtime as needed. 08/30/19  Yes [provider]    Family History Family History  Problem Relation Age of Onset   Heart attack Mother    Kidney failure Mother    Healthy Father     Social History Social History   Tobacco Use   Smoking status: Every Day    Packs/day: 1.5    Types: Cigarettes   Smokeless tobacco: Never  Vaping Use   Vaping Use: Never used  Substance Use Topics   Alcohol use: No   Drug use: Not Currently     Allergies   Morphine and codeine and Acetaminophen-codeine   Review  of Systems Review of Systems  HENT:  Positive for sore throat.   Neurological:  Positive for headaches.  All other systems reviewed and are negative.    Physical Exam Triage Vital Signs ED Triage Vitals  Enc Vitals Group     BP 11/15/22 1721 129/79     Pulse Rate 11/15/22 1721 (!) 58     Resp 11/15/22 1721 18     Temp 11/15/22 1721 98.4 F (36.9 C)     Temp Source 11/15/22 1721 Oral     SpO2 11/15/22 1721 96 %     Weight --      Height 11/15/22 1723 5\' 2"  (1.575 m)     Head Circumference --      Peak Flow --      Pain Score 11/15/22 1723 3     Pain Loc --      Pain Edu? --      Excl. in GC? --    No data found.  Updated Vital Signs BP 129/79 (BP Location: Right Arm)   Pulse (!) 58    Temp 98.4 F (36.9 C) (Oral)   Resp 18   Ht 5\' 2"  (1.575 m)   SpO2 96%   BMI 23.78 kg/m      Physical Exam Vitals and nursing note reviewed.  Constitutional:      General: She is not in acute distress.    Appearance: Normal appearance. She is well-developed and normal weight. She is ill-appearing.  HENT:     Head: Normocephalic and atraumatic.     Right Ear: External ear normal.     Left Ear: Ear canal and external ear normal.     Ears:     Comments: Left TM-red rimmed, retracted; Right EAC-occluded by excessive cerumen unable to visualize right TM-patient defers ear lavage this evening.    Mouth/Throat:     Mouth: Mucous membranes are moist.     Pharynx: Uvula midline. Pharyngeal swelling and uvula swelling present.  Eyes:     Extraocular Movements: Extraocular movements intact.     Conjunctiva/sclera: Conjunctivae normal.     Pupils: Pupils are equal, round, and reactive to light.  Cardiovascular:     Rate and Rhythm: Normal rate and regular rhythm.     Heart sounds: Normal heart sounds.  Pulmonary:     Effort: Pulmonary effort is normal.     Breath sounds: Normal breath sounds. No wheezing, rhonchi or rales.     Comments: Frequent nonproductive cough noted on exam Musculoskeletal:        General: Normal range of motion.     Cervical back: Neck supple. No tenderness.  Lymphadenopathy:     Cervical: Cervical adenopathy present.  Skin:    General: Skin is warm and dry.  Neurological:     General: No focal deficit present.     Mental Status: She is alert and oriented to person, place, and time.      UC Treatments / Results  Labs (all labs ordered are listed, but only abnormal results are displayed) Labs Reviewed  POCT RAPID STREP A (OFFICE)    EKG   Radiology No results found.  Procedures Procedures (including critical care time)  Medications Ordered in UC Medications - No data to display  Initial Impression / Assessment and Plan / UC Course  I  have reviewed the triage vital signs and the nursing notes.  Pertinent labs & imaging results that were available during my care of the patient were reviewed  by me and considered in my medical decision making (see chart for details).     MDM: 1.  Uvulitis-Rx'd Augmentin 875/125 mg tablet twice daily x 10 days; 2.  Cough, unspecified type-Rx Sterapred Unipak (tapering patient from 60 mg to 10 mg over 10 days). Advised patient to take medications as directed with food to completion.  Advised patient to take prednisone with first dose of Augmentin for the next 10 days.  3.  Impacted cerumen of right ear-patient deferred ear lavage this evening.  Encouraged to patient to increase daily water intake to 64 ounces per day while taking these medications.  Encourage patient to smoke less daily while taking these medications.  Advised if symptoms worsen and/or unresolved please follow-up PCP or here for further evaluation.  Discharged home, hemodynamically stable.   Final Clinical Impressions(s) / UC Diagnoses   Final diagnoses:  Uvulitis  Cough, unspecified type  Impacted cerumen of right ear     Discharge Instructions      Advised patient to take medications as directed with food to completion.  Advised patient to take prednisone with first dose of Augmentin for the next 10 days.  Encouraged to patient to increase daily water intake to 64 ounces per day while taking these medications.  Encourage patient to smoke less daily while taking these medications.  Advised if symptoms worsen and/or unresolved please follow-up PCP or here for further evaluation.     ED Prescriptions     Medication Sig Dispense Auth. Provider   amoxicillin-clavulanate (AUGMENTIN) 875-125 MG tablet Take 1 tablet by mouth 2 (two) times daily for 10 days. 20 tablet Trevor Iha, FNP   predniSONE (STERAPRED UNI-PAK 21 TAB) 10 MG (21) TBPK tablet Take by mouth daily. Take 6 tabs by mouth daily  for 2 days, then 5 tabs for 2  days, then 4 tabs for 2 days, then 3 tabs for 2 days, 2 tabs for 2 days, then 1 tab by mouth daily for 2 days 42 tablet Trevor Iha, FNP      PDMP not reviewed this encounter.   Trevor Iha, FNP 11/15/22 1926

## 2022-11-15 NOTE — Discharge Instructions (Addendum)
Advised patient to take medications as directed with food to completion.  Advised patient to take prednisone with first dose of Augmentin for the next 10 days.  Encouraged to patient to increase daily water intake to 64 ounces per day while taking these medications.  Encourage patient to smoke less daily while taking these medications.  Advised if symptoms worsen and/or unresolved please follow-up PCP or here for further evaluation.

## 2022-11-19 ENCOUNTER — Telehealth: Payer: Self-pay

## 2022-11-26 ENCOUNTER — Other Ambulatory Visit (HOSPITAL_COMMUNITY): Payer: Self-pay

## 2022-12-11 DIAGNOSIS — M170 Bilateral primary osteoarthritis of knee: Secondary | ICD-10-CM | POA: Diagnosis not present

## 2022-12-11 DIAGNOSIS — G4700 Insomnia, unspecified: Secondary | ICD-10-CM | POA: Diagnosis not present

## 2022-12-11 DIAGNOSIS — M47816 Spondylosis without myelopathy or radiculopathy, lumbar region: Secondary | ICD-10-CM | POA: Diagnosis not present

## 2022-12-11 DIAGNOSIS — G894 Chronic pain syndrome: Secondary | ICD-10-CM | POA: Diagnosis not present

## 2023-01-08 DIAGNOSIS — G4700 Insomnia, unspecified: Secondary | ICD-10-CM | POA: Diagnosis not present

## 2023-01-08 DIAGNOSIS — M170 Bilateral primary osteoarthritis of knee: Secondary | ICD-10-CM | POA: Diagnosis not present

## 2023-01-08 DIAGNOSIS — Z79891 Long term (current) use of opiate analgesic: Secondary | ICD-10-CM | POA: Diagnosis not present

## 2023-01-08 DIAGNOSIS — M47816 Spondylosis without myelopathy or radiculopathy, lumbar region: Secondary | ICD-10-CM | POA: Diagnosis not present

## 2023-01-08 DIAGNOSIS — G894 Chronic pain syndrome: Secondary | ICD-10-CM | POA: Diagnosis not present

## 2023-01-30 ENCOUNTER — Encounter: Payer: Self-pay | Admitting: Family Medicine

## 2023-01-30 ENCOUNTER — Telehealth: Payer: Self-pay | Admitting: Family Medicine

## 2023-01-30 DIAGNOSIS — Z124 Encounter for screening for malignant neoplasm of cervix: Secondary | ICD-10-CM

## 2023-01-30 DIAGNOSIS — Z1211 Encounter for screening for malignant neoplasm of colon: Secondary | ICD-10-CM

## 2023-01-30 NOTE — Telephone Encounter (Signed)
Due for cervical and colon cancer screening.    Please call pt and see if has gyn for pap and if interestd in cologuard?

## 2023-01-31 NOTE — Telephone Encounter (Signed)
Order for cologuard and referral for ob/gyn for down the hall placed for pt.

## 2023-02-18 DIAGNOSIS — G894 Chronic pain syndrome: Secondary | ICD-10-CM | POA: Diagnosis not present

## 2023-02-18 DIAGNOSIS — G4700 Insomnia, unspecified: Secondary | ICD-10-CM | POA: Diagnosis not present

## 2023-02-18 DIAGNOSIS — M170 Bilateral primary osteoarthritis of knee: Secondary | ICD-10-CM | POA: Diagnosis not present

## 2023-02-18 DIAGNOSIS — M47816 Spondylosis without myelopathy or radiculopathy, lumbar region: Secondary | ICD-10-CM | POA: Diagnosis not present

## 2023-03-18 DIAGNOSIS — G4700 Insomnia, unspecified: Secondary | ICD-10-CM | POA: Diagnosis not present

## 2023-03-18 DIAGNOSIS — M170 Bilateral primary osteoarthritis of knee: Secondary | ICD-10-CM | POA: Diagnosis not present

## 2023-03-18 DIAGNOSIS — M47816 Spondylosis without myelopathy or radiculopathy, lumbar region: Secondary | ICD-10-CM | POA: Diagnosis not present

## 2023-03-18 DIAGNOSIS — G894 Chronic pain syndrome: Secondary | ICD-10-CM | POA: Diagnosis not present

## 2023-04-15 ENCOUNTER — Encounter: Payer: Self-pay | Admitting: Family Medicine

## 2023-04-15 ENCOUNTER — Ambulatory Visit (INDEPENDENT_AMBULATORY_CARE_PROVIDER_SITE_OTHER): Payer: 59 | Admitting: Family Medicine

## 2023-04-15 VITALS — Ht 61.0 in | Wt 120.0 lb

## 2023-04-15 DIAGNOSIS — Z0000 Encounter for general adult medical examination without abnormal findings: Secondary | ICD-10-CM | POA: Diagnosis not present

## 2023-04-15 DIAGNOSIS — F17200 Nicotine dependence, unspecified, uncomplicated: Secondary | ICD-10-CM | POA: Diagnosis not present

## 2023-04-15 DIAGNOSIS — Z1231 Encounter for screening mammogram for malignant neoplasm of breast: Secondary | ICD-10-CM

## 2023-04-15 DIAGNOSIS — Z0100 Encounter for examination of eyes and vision without abnormal findings: Secondary | ICD-10-CM

## 2023-04-15 NOTE — Progress Notes (Signed)
Subjective:   Carolyn Bauer is a 62 y.o. female who presents for Medicare Annual (Subsequent) preventive examination.  Visit Complete: Virtual I connected with  Carolyn Bauer on 04/15/23 by a audio enabled telemedicine application and verified that I am speaking with the correct person using two identifiers.  Patient Location: Home  Provider Location: Office/Clinic  I discussed the limitations of evaluation and management by telemedicine. The patient expressed understanding and agreed to proceed.  Vital Signs: Because this visit was a virtual/telehealth visit, some criteria may be missing or patient reported. Any vitals not documented were not able to be obtained and vitals that have been documented are patient reported.  Patient Medicare AWV questionnaire was completed by the patient on 04/15/23; I have confirmed that all information answered by patient is correct and no changes since this date.  Cardiac Risk Factors include: smoking/ tobacco exposure     Objective:    Today's Vitals   04/15/23 1446  Weight: 120 lb (54.4 kg)  Height: 5\' 1"  (1.549 m)  PainSc: 3    Body mass index is 22.67 kg/m.     04/15/2023    2:56 PM 04/09/2022    2:57 PM  Advanced Directives  Does Patient Have a Medical Advance Directive? No No  Would patient like information on creating a medical advance directive? No - Patient declined No - Patient declined    Current Medications (verified) Outpatient Encounter Medications as of 04/15/2023  Medication Sig   HYDROcodone-acetaminophen (NORCO) 10-325 MG tablet Take 1 tablet by mouth 5 (five) times daily as needed.   zolpidem (AMBIEN) 10 MG tablet Take 10 mg by mouth at bedtime as needed.   HYDROcodone-acetaminophen (NORCO) 10-325 MG tablet Take 1 tablet by mouth 5 (five) times daily as needed. (Patient not taking: Reported on 04/15/2023)   HYDROcodone-acetaminophen (NORCO) 10-325 MG tablet Take 1 tablet by mouth 5 (five) times daily as needed. (Patient  not taking: Reported on 04/15/2023)   HYDROcodone-acetaminophen (NORCO) 10-325 MG tablet Take 1 tablet by mouth 5 (five) times daily as needed. (Patient not taking: Reported on 04/15/2023)   HYDROcodone-acetaminophen (NORCO) 10-325 MG tablet Take 1 tablet by mouth five times a day as needed. (Patient not taking: Reported on 04/15/2023)   HYDROcodone-acetaminophen (NORCO) 10-325 MG tablet Take 1 tablet by mouth 5 (five) times daily as needed. (Patient not taking: Reported on 04/15/2023)   HYDROcodone-acetaminophen (NORCO) 10-325 MG tablet Take 1 tablet by mouth five times a day as needed (Patient not taking: Reported on 04/15/2023)   methadone (DOLOPHINE) 10 MG tablet Take 10 mg by mouth every 8 (eight) hours. (Patient not taking: Reported on 04/15/2023)   predniSONE (STERAPRED UNI-PAK 21 TAB) 10 MG (21) TBPK tablet Take by mouth daily. Take 6 tabs by mouth daily  for 2 days, then 5 tabs for 2 days, then 4 tabs for 2 days, then 3 tabs for 2 days, 2 tabs for 2 days, then 1 tab by mouth daily for 2 days (Patient not taking: Reported on 04/15/2023)   No facility-administered encounter medications on file as of 04/15/2023.    Allergies (verified) Morphine and codeine and Acetaminophen-codeine   History: Past Medical History:  Diagnosis Date   Back pain    Fibromyalgia    Thyroid disease    Past Surgical History:  Procedure Laterality Date   CESAREAN SECTION     Family History  Problem Relation Age of Onset   Heart attack Mother    Kidney failure Mother  Healthy Father    Social History   Socioeconomic History   Marital status: Divorced    Spouse name: Not on file   Number of children: 3   Years of education: 10   Highest education level: 10th grade  Occupational History   Occupation: disabled.  Tobacco Use   Smoking status: Every Day    Current packs/day: 1.50    Types: Cigarettes   Smokeless tobacco: Never  Vaping Use   Vaping status: Never Used  Substance and Sexual Activity    Alcohol use: No   Drug use: Not Currently   Sexual activity: Not on file  Other Topics Concern   Not on file  Social History Narrative   2 of her children and 4 grandchildren live with her. She enjoys doing crafts.   Social Determinants of Health   Financial Resource Strain: Medium Risk (04/15/2023)   Overall Financial Resource Strain (CARDIA)    Difficulty of Paying Living Expenses: Somewhat hard  Food Insecurity: No Food Insecurity (04/15/2023)   Hunger Vital Sign    Worried About Running Out of Food in the Last Year: Never true    Ran Out of Food in the Last Year: Never true  Transportation Needs: No Transportation Needs (04/15/2023)   PRAPARE - Administrator, Civil Service (Medical): No    Lack of Transportation (Non-Medical): No  Physical Activity: Inactive (04/09/2022)   Exercise Vital Sign    Days of Exercise per Week: 0 days    Minutes of Exercise per Session: 0 min  Stress: Stress Concern Present (04/15/2023)   [employer] of Occupational Health - Occupational Stress Questionnaire    Feeling of Stress : To some extent  Social Connections: Socially Isolated (04/15/2023)   Social Connection and Isolation Panel [NHANES]    Frequency of Communication with Friends and Family: More than three times a week    Frequency of Social Gatherings with Friends and Family: Twice a week    Attends Religious Services: Never    Database administrator or Organizations: No    Attends Engineer, structural: Never    Marital Status: Divorced    Tobacco Counseling Ready to quit: Not Answered Counseling given: Not Answered   Clinical Intake:  Pre-visit preparation completed: No  Pain : 0-10 Pain Score: 3  Pain Type: Chronic pain Pain Location: Back Pain Orientation: Lower Pain Descriptors / Indicators: Constant Pain Onset: More than a month ago Pain Frequency: Constant Pain Relieving Factors: nice weather, limiting activity, pain meds Effect of Pain on  Daily Activities: yes,  Pain Relieving Factors: nice weather, limiting activity, pain meds  BMI - recorded: 22.6 Nutritional Status: BMI of 19-24  Normal Nutritional Risks: None Diabetes: No  How often do you need to have someone help you when you read instructions, pamphlets, or other written materials from your doctor or pharmacy?: 1 - Never What is the last grade level you completed in school?: 10         Activities of Daily Living    04/15/2023    2:48 PM  In your present state of health, do you have any difficulty performing the following activities:  Hearing? 0  Vision? 1  Comment needs to get eye exam,  Difficulty concentrating or making decisions? 0  Walking or climbing stairs? 0  Dressing or bathing? 0  Doing errands, shopping? 0  Preparing Food and eating ? N  Using the Toilet? N  In the past six months, have  you accidently leaked urine? Y  Comment coughing  Do you have problems with loss of bowel control? N  Managing your Medications? N  Managing your Finances? N  Housekeeping or managing your Housekeeping? N    Patient Care Team: Agapito Games, MD as PCP - Clenton Pare, MD  pain management   Indicate any recent Medical Services you may have received from other than [facility] providers in the past year (date may be approximate).     Assessment:   This is a routine wellness examination for Kae.  Hearing/Vision screen Hearing Screening - Comments:: Unable to test, grossly intact.  Vision Screening - Comments:: Unable to test, needs appointment, uses readers, needs prescription.    Goals Addressed             This Visit's Progress    Stop or Cut Down Tobacco Use       Why is this important?   To stop or cut down it is important to have support from a person or group of people who you can count on.  You will also need to think about the things that make you feel like smoking, then plan for how to handle them.    Notes:       Depression  Screen    04/15/2023    2:55 PM 04/09/2022    3:01 PM  PHQ 2/9 Scores  PHQ - 2 Score 1 0    Fall Risk    04/15/2023    2:57 PM 04/09/2022    3:01 PM 09/18/2019    1:19 PM  Fall Risk   Falls in the past year? 0 0 0  Number falls in past yr: 0 0 0  Injury with Fall? 0 0   Risk for fall due to : No Fall Risks No Fall Risks No Fall Risks  Follow up  Falls evaluation completed     MEDICARE RISK AT HOME: Medicare Risk at Home Any stairs in or around the home?: Yes If so, are there any without handrails?: Yes Home free of loose throw rugs in walkways, pet beds, electrical cords, etc?: Yes Adequate lighting in your home to reduce risk of falls?: Yes Life alert?: No Use of a cane, walker or w/c?: No Grab bars in the bathroom?: No Shower chair or bench in shower?: Yes Elevated toilet seat or a handicapped toilet?: No  TIMED UP AND GO:  Was the test performed?  No    Cognitive Function:        04/15/2023    2:58 PM 04/09/2022    3:05 PM  6CIT Screen  What Year? 0 points 0 points  What month? 0 points 0 points  What time? 0 points 0 points  Count back from 20 0 points 0 points  Months in reverse 0 points 0 points  Repeat phrase 0 points 0 points  Total Score 0 points 0 points    Immunizations  There is no immunization history on file for this patient.  TDAP status: Due, Education has been provided regarding the importance of this vaccine. Advised may receive this vaccine at local pharmacy or Health Dept. Aware to provide a copy of the vaccination record if obtained from local pharmacy or Health Dept. Verbalized acceptance and understanding.  Flu Vaccine status: Declined, Education has been provided regarding the importance of this vaccine but patient still declined. Advised may receive this vaccine at local pharmacy or Health Dept. Aware to provide a copy of the vaccination record  if obtained from local pharmacy or Health Dept. Verbalized acceptance and  understanding.  Pneumococcal vaccine status: Declined,  Education has been provided regarding the importance of this vaccine but patient still declined. Advised may receive this vaccine at local pharmacy or Health Dept. Aware to provide a copy of the vaccination record if obtained from local pharmacy or Health Dept. Verbalized acceptance and understanding.   Covid-19 vaccine status: Declined, Education has been provided regarding the importance of this vaccine but patient still declined. Advised may receive this vaccine at local pharmacy or Health Dept.or vaccine clinic. Aware to provide a copy of the vaccination record if obtained from local pharmacy or Health Dept. Verbalized acceptance and understanding.  Qualifies for Shingles Vaccine? Yes   Zostavax completed No   Shingrix Completed?: No.    Education has been provided regarding the importance of this vaccine. Patient has been advised to call insurance company to determine out of pocket expense if they have not yet received this vaccine. Advised may also receive vaccine at local pharmacy or Health Dept. Verbalized acceptance and understanding.  Screening Tests Health Maintenance  Topic Date Due   COVID-19 Vaccine (1) Never done   Pneumococcal Vaccine 74-72 Years old (1 of 2 - PCV) Never done   HIV Screening  Never done   Hepatitis C Screening  Never done   DTaP/Tdap/Td (1 - Tdap) Never done   Zoster Vaccines- Shingrix (1 of 2) Never done   Cervical Cancer Screening (HPV/Pap Cotest)  Never done   Colonoscopy  Never done   MAMMOGRAM  Never done   INFLUENZA VACCINE  Never done   Medicare Annual Wellness (AWV)  04/14/2024   HPV VACCINES  Aged Out    Health Maintenance  Health Maintenance Due  Topic Date Due   COVID-19 Vaccine (1) Never done   Pneumococcal Vaccine 63-47 Years old (1 of 2 - PCV) Never done   HIV Screening  Never done   Hepatitis C Screening  Never done   DTaP/Tdap/Td (1 - Tdap) Never done   Zoster Vaccines-  Shingrix (1 of 2) Never done   Cervical Cancer Screening (HPV/Pap Cotest)  Never done   Colonoscopy  Never done   MAMMOGRAM  Never done   INFLUENZA VACCINE  Never done    Cologuard: has at home and will try to do 04/16/23.   Mammogram status: Ordered 04/15/23. Pt provided with contact info and advised to call to schedule appt.   Bone density: n/a   Lung Cancer Screening: (Low Dose CT Chest recommended if Age 57-80 years, 20 pack-year currently smoking OR have quit w/in 15years.) does qualify.   Lung Cancer Screening Referral: will place referral today  Additional Screening:  Hepatitis C Screening: does qualify; Completed has not completed.   Vision Screening: Recommended annual ophthalmology exams for early detection of glaucoma and other disorders of the eye. Is the patient up to date with their annual eye exam?  Yes  Who is the provider or what is the name of the office in which the patient attends annual eye exams? Will place referral today.  If pt is not established with a provider, would they like to be referred to a provider to establish care? yes  Dental Screening: Recommended annual dental exams for proper oral hygiene  Diabetic Foot Exam: n/a   Community Resource Referral / Chronic Care Management: CRR required this visit?  No   CCM required this visit?  No     Plan:  I have personally reviewed and noted the following in the patient's chart:   Medical and social history Use of alcohol, tobacco or illicit drugs  Current medications and supplements including opioid prescriptions. Patient is currently taking opioid prescriptions. Information provided to patient regarding non-opioid alternatives. Patient advised to discuss non-opioid treatment plan with their provider. Functional ability and status Nutritional status Physical activity Advanced directives List of other physicians Hospitalizations, surgeries, and ER visits in previous 12 months: no   Vitals Screenings to include cognitive, depression, and falls Referrals and appointments  In addition, I have reviewed and discussed with patient certain preventive protocols, quality metrics, and best practice recommendations. A written personalized care plan for preventive services as well as general preventive health recommendations were provided to patient.     Novella Olive, FNP   04/15/2023   After Visit Summary: (MyChart) Due to this being a telephonic visit, the after visit summary with patients personalized plan was offered to patient via MyChart   Follow-up with PCP as scheduled. Referrals today: mammogram, CT scan for lung cancer screening, ophthalmology Declines need for T dap, Influenza, covid, and Shingrix vaccines.   Will discuss Hep C screening with PCP.  Discussed Cologuard. She has this at home and has felt intimidated to try it. Discussed the ease of this test and she will try it tomorrow morning.

## 2023-04-17 DIAGNOSIS — M170 Bilateral primary osteoarthritis of knee: Secondary | ICD-10-CM | POA: Diagnosis not present

## 2023-04-17 DIAGNOSIS — M47816 Spondylosis without myelopathy or radiculopathy, lumbar region: Secondary | ICD-10-CM | POA: Diagnosis not present

## 2023-04-17 DIAGNOSIS — G4700 Insomnia, unspecified: Secondary | ICD-10-CM | POA: Diagnosis not present

## 2023-04-17 DIAGNOSIS — G894 Chronic pain syndrome: Secondary | ICD-10-CM | POA: Diagnosis not present

## 2023-04-23 ENCOUNTER — Telehealth (HOSPITAL_BASED_OUTPATIENT_CLINIC_OR_DEPARTMENT_OTHER): Payer: Self-pay | Admitting: Family Medicine

## 2023-05-21 DIAGNOSIS — G894 Chronic pain syndrome: Secondary | ICD-10-CM | POA: Diagnosis not present

## 2023-05-21 DIAGNOSIS — M170 Bilateral primary osteoarthritis of knee: Secondary | ICD-10-CM | POA: Diagnosis not present

## 2023-05-21 DIAGNOSIS — M47816 Spondylosis without myelopathy or radiculopathy, lumbar region: Secondary | ICD-10-CM | POA: Diagnosis not present

## 2023-05-21 DIAGNOSIS — G4700 Insomnia, unspecified: Secondary | ICD-10-CM | POA: Diagnosis not present

## 2023-06-12 ENCOUNTER — Ambulatory Visit: Payer: 59

## 2023-06-17 DIAGNOSIS — G894 Chronic pain syndrome: Secondary | ICD-10-CM | POA: Diagnosis not present

## 2023-06-17 DIAGNOSIS — G4700 Insomnia, unspecified: Secondary | ICD-10-CM | POA: Diagnosis not present

## 2023-06-17 DIAGNOSIS — M170 Bilateral primary osteoarthritis of knee: Secondary | ICD-10-CM | POA: Diagnosis not present

## 2023-06-17 DIAGNOSIS — M47816 Spondylosis without myelopathy or radiculopathy, lumbar region: Secondary | ICD-10-CM | POA: Diagnosis not present

## 2023-07-16 DIAGNOSIS — G4700 Insomnia, unspecified: Secondary | ICD-10-CM | POA: Diagnosis not present

## 2023-07-16 DIAGNOSIS — M170 Bilateral primary osteoarthritis of knee: Secondary | ICD-10-CM | POA: Diagnosis not present

## 2023-07-16 DIAGNOSIS — M47816 Spondylosis without myelopathy or radiculopathy, lumbar region: Secondary | ICD-10-CM | POA: Diagnosis not present

## 2023-07-16 DIAGNOSIS — G894 Chronic pain syndrome: Secondary | ICD-10-CM | POA: Diagnosis not present

## 2023-08-13 DIAGNOSIS — M47816 Spondylosis without myelopathy or radiculopathy, lumbar region: Secondary | ICD-10-CM | POA: Diagnosis not present

## 2023-08-13 DIAGNOSIS — G894 Chronic pain syndrome: Secondary | ICD-10-CM | POA: Diagnosis not present

## 2023-08-13 DIAGNOSIS — M170 Bilateral primary osteoarthritis of knee: Secondary | ICD-10-CM | POA: Diagnosis not present

## 2023-08-13 DIAGNOSIS — G4700 Insomnia, unspecified: Secondary | ICD-10-CM | POA: Diagnosis not present

## 2023-09-16 DIAGNOSIS — M47816 Spondylosis without myelopathy or radiculopathy, lumbar region: Secondary | ICD-10-CM | POA: Diagnosis not present

## 2023-09-16 DIAGNOSIS — G894 Chronic pain syndrome: Secondary | ICD-10-CM | POA: Diagnosis not present

## 2023-09-16 DIAGNOSIS — G4700 Insomnia, unspecified: Secondary | ICD-10-CM | POA: Diagnosis not present

## 2023-09-16 DIAGNOSIS — M170 Bilateral primary osteoarthritis of knee: Secondary | ICD-10-CM | POA: Diagnosis not present

## 2023-10-14 DIAGNOSIS — M47816 Spondylosis without myelopathy or radiculopathy, lumbar region: Secondary | ICD-10-CM | POA: Diagnosis not present

## 2023-10-14 DIAGNOSIS — M170 Bilateral primary osteoarthritis of knee: Secondary | ICD-10-CM | POA: Diagnosis not present

## 2023-10-14 DIAGNOSIS — G894 Chronic pain syndrome: Secondary | ICD-10-CM | POA: Diagnosis not present

## 2023-10-14 DIAGNOSIS — G4700 Insomnia, unspecified: Secondary | ICD-10-CM | POA: Diagnosis not present

## 2023-10-28 DIAGNOSIS — R079 Chest pain, unspecified: Secondary | ICD-10-CM | POA: Diagnosis not present

## 2023-10-28 DIAGNOSIS — Z79899 Other long term (current) drug therapy: Secondary | ICD-10-CM | POA: Diagnosis not present

## 2023-10-28 DIAGNOSIS — R0789 Other chest pain: Secondary | ICD-10-CM | POA: Diagnosis not present

## 2023-10-28 DIAGNOSIS — Z885 Allergy status to narcotic agent status: Secondary | ICD-10-CM | POA: Diagnosis not present

## 2023-10-28 DIAGNOSIS — F17210 Nicotine dependence, cigarettes, uncomplicated: Secondary | ICD-10-CM | POA: Diagnosis not present

## 2023-10-28 DIAGNOSIS — R072 Precordial pain: Secondary | ICD-10-CM | POA: Diagnosis not present

## 2023-11-13 DIAGNOSIS — M170 Bilateral primary osteoarthritis of knee: Secondary | ICD-10-CM | POA: Diagnosis not present

## 2023-11-13 DIAGNOSIS — G894 Chronic pain syndrome: Secondary | ICD-10-CM | POA: Diagnosis not present

## 2023-11-13 DIAGNOSIS — G4700 Insomnia, unspecified: Secondary | ICD-10-CM | POA: Diagnosis not present

## 2023-11-13 DIAGNOSIS — M47816 Spondylosis without myelopathy or radiculopathy, lumbar region: Secondary | ICD-10-CM | POA: Diagnosis not present

## 2023-12-11 DIAGNOSIS — M170 Bilateral primary osteoarthritis of knee: Secondary | ICD-10-CM | POA: Diagnosis not present

## 2023-12-11 DIAGNOSIS — G4700 Insomnia, unspecified: Secondary | ICD-10-CM | POA: Diagnosis not present

## 2023-12-11 DIAGNOSIS — M47816 Spondylosis without myelopathy or radiculopathy, lumbar region: Secondary | ICD-10-CM | POA: Diagnosis not present

## 2023-12-11 DIAGNOSIS — G894 Chronic pain syndrome: Secondary | ICD-10-CM | POA: Diagnosis not present

## 2024-01-08 DIAGNOSIS — G4700 Insomnia, unspecified: Secondary | ICD-10-CM | POA: Diagnosis not present

## 2024-01-08 DIAGNOSIS — G894 Chronic pain syndrome: Secondary | ICD-10-CM | POA: Diagnosis not present

## 2024-01-08 DIAGNOSIS — M47816 Spondylosis without myelopathy or radiculopathy, lumbar region: Secondary | ICD-10-CM | POA: Diagnosis not present

## 2024-01-08 DIAGNOSIS — M170 Bilateral primary osteoarthritis of knee: Secondary | ICD-10-CM | POA: Diagnosis not present

## 2024-02-27 ENCOUNTER — Telehealth: Payer: Self-pay

## 2024-02-27 DIAGNOSIS — Z1211 Encounter for screening for malignant neoplasm of colon: Secondary | ICD-10-CM

## 2024-02-27 NOTE — Telephone Encounter (Signed)
 Carolyn Bauer agreed to do the Cologuard. Order placed.

## 2024-03-04 DIAGNOSIS — M47816 Spondylosis without myelopathy or radiculopathy, lumbar region: Secondary | ICD-10-CM | POA: Diagnosis not present

## 2024-03-04 DIAGNOSIS — M170 Bilateral primary osteoarthritis of knee: Secondary | ICD-10-CM | POA: Diagnosis not present

## 2024-03-04 DIAGNOSIS — G894 Chronic pain syndrome: Secondary | ICD-10-CM | POA: Diagnosis not present

## 2024-03-04 DIAGNOSIS — G4700 Insomnia, unspecified: Secondary | ICD-10-CM | POA: Diagnosis not present

## 2024-04-20 ENCOUNTER — Ambulatory Visit: Admitting: Family Medicine
